# Patient Record
Sex: Female | Born: 1980 | Race: White | Hispanic: No | Marital: Married | Smoking: Never smoker
Health system: Southern US, Community
[De-identification: ages and names within clinical notes are randomized; demographics above are authoritative.]

## PROBLEM LIST (undated history)

## (undated) DIAGNOSIS — Z8619 Personal history of other infectious and parasitic diseases: Secondary | ICD-10-CM

## (undated) DIAGNOSIS — D649 Anemia, unspecified: Secondary | ICD-10-CM

## (undated) DIAGNOSIS — R011 Cardiac murmur, unspecified: Secondary | ICD-10-CM

## (undated) DIAGNOSIS — R51 Headache: Secondary | ICD-10-CM

## (undated) DIAGNOSIS — R519 Headache, unspecified: Secondary | ICD-10-CM

## (undated) DIAGNOSIS — T7840XA Allergy, unspecified, initial encounter: Secondary | ICD-10-CM

## (undated) DIAGNOSIS — F419 Anxiety disorder, unspecified: Secondary | ICD-10-CM

## (undated) HISTORY — DX: Headache, unspecified: R51.9

## (undated) HISTORY — DX: Personal history of other infectious and parasitic diseases: Z86.19

## (undated) HISTORY — DX: Anemia, unspecified: D64.9

## (undated) HISTORY — PX: BREAST SURGERY: SHX581

## (undated) HISTORY — DX: Allergy, unspecified, initial encounter: T78.40XA

## (undated) HISTORY — PX: KIDNEY SURGERY: SHX687

## (undated) HISTORY — DX: Cardiac murmur, unspecified: R01.1

## (undated) HISTORY — DX: Anxiety disorder, unspecified: F41.9

## (undated) HISTORY — DX: Headache: R51

---
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
## 2001-03-13 ENCOUNTER — Other Ambulatory Visit: Admission: RE | Admit: 2001-03-13 | Discharge: 2001-03-13 | Payer: Self-pay | Admitting: Obstetrics and Gynecology

## 2001-10-14 ENCOUNTER — Encounter (INDEPENDENT_AMBULATORY_CARE_PROVIDER_SITE_OTHER): Payer: Self-pay | Admitting: *Deleted

## 2001-10-14 ENCOUNTER — Ambulatory Visit (HOSPITAL_BASED_OUTPATIENT_CLINIC_OR_DEPARTMENT_OTHER): Admission: RE | Admit: 2001-10-14 | Discharge: 2001-10-14 | Payer: Self-pay | Admitting: Surgery

## 2004-06-17 ENCOUNTER — Emergency Department (HOSPITAL_COMMUNITY): Admission: EM | Admit: 2004-06-17 | Discharge: 2004-06-18 | Payer: Self-pay | Admitting: Emergency Medicine

## 2004-06-20 ENCOUNTER — Ambulatory Visit: Payer: Self-pay | Admitting: *Deleted

## 2004-06-20 ENCOUNTER — Inpatient Hospital Stay (HOSPITAL_COMMUNITY): Admission: EM | Admit: 2004-06-20 | Discharge: 2004-06-25 | Payer: Self-pay | Admitting: Urology

## 2004-06-21 ENCOUNTER — Encounter (INDEPENDENT_AMBULATORY_CARE_PROVIDER_SITE_OTHER): Payer: Self-pay | Admitting: Specialist

## 2005-12-23 IMAGING — CT CT ABDOMEN WO/W CM
1 of 4 series · 11 of 32 positions shown, 17 images · IV contrast (omnipaque)
Comparison: none

CLINICAL DATA: 23 year old; pyelonephritis with worsening pain and fever.  Clinical suspicion for an abscess.
TECHNIQUE: Helical CT examination of the abdomen and pelvis was performed pre- and post- administration of a total of 100 cc of Omnipaque 300.  This study is compared with a prior CT from 06/18/04.
 The patient has developed a right-sided pleural effusion with overlying right basilar atelectasis.  There is also minimal left basilar atelectasis.
 CT ABDOMEN WITHOUT AND WITH CONTRAST:
 Noncontrasted images of the abdomen show areas of high attenuation in the large cyst and also around the right kidney.  The findings suggest hemorrhage.  Fat planes around the kidney are blurred.  There is fluid tracking down into the pelvis.  
 The contrast enhanced images show normal enhancement of the right kidney.  Again, there appears to be some hemorrhage in this large cyst and some hemorrhage outside the kidney.  
 On the delayed images, there is a new area of increased attenuation within the cyst which may be evidence of active bleeding or extravasation from the collecting system into the cyst.  I really don?t see any findings in the remainder of the kidneys to suggest pyelonephritis.  It is possible that this is an abscess that has bled.  It could be a cystic neoplasm that has hemorrhaged.  There is also some blood in the hepatorenal fossa and around the gallbladder.  
 The left kidney is normal.  The pancreas, adrenal glands, liver, and spleen demonstrate no significant findings.

[Series 3: abd_pel 5.0 b40f st · axial · 0.61mm/px · z∈[-638,-222]mm · 11 of 101 slices shown, 17 images]
[im 9/101  soft-tissue]
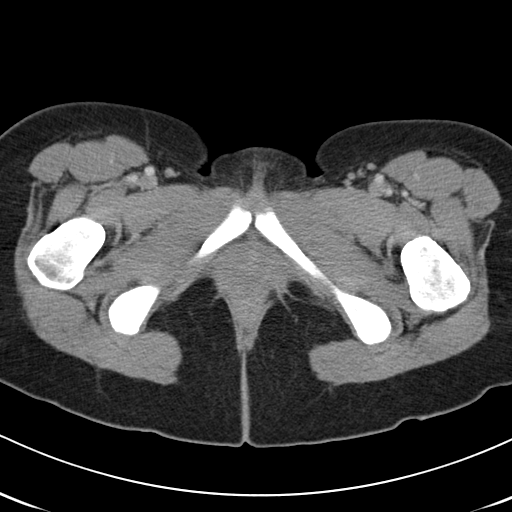
[im 9/101  bone]
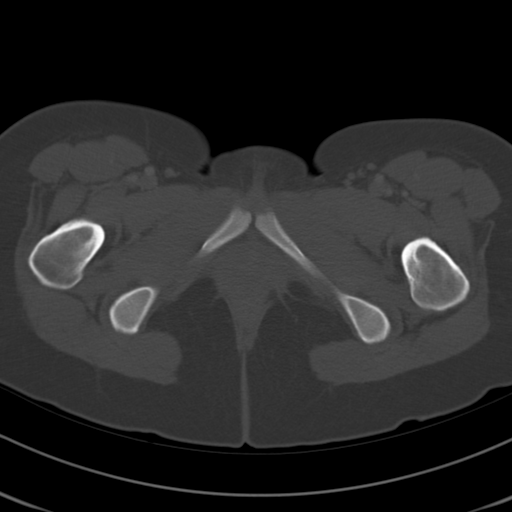
[im 17/101  soft-tissue]
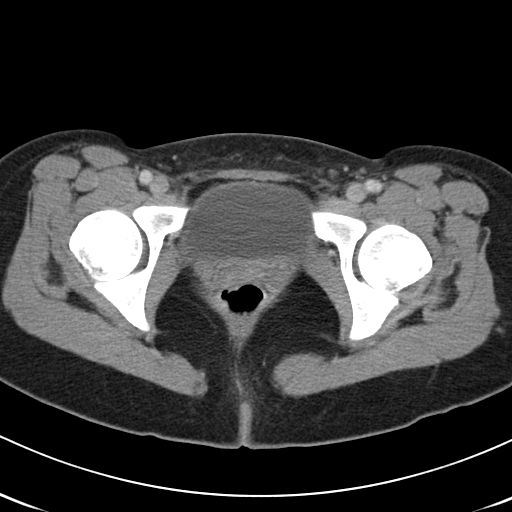
[im 26/101  soft-tissue]
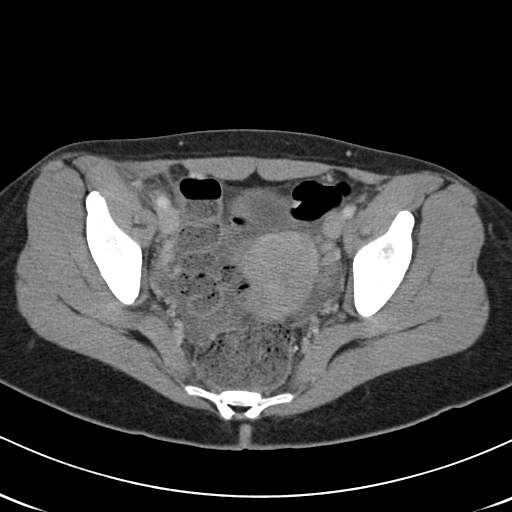
[im 34/101  soft-tissue]
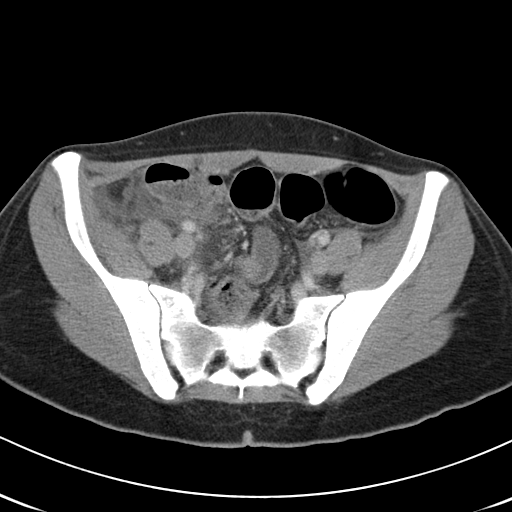
[im 42/101  soft-tissue]
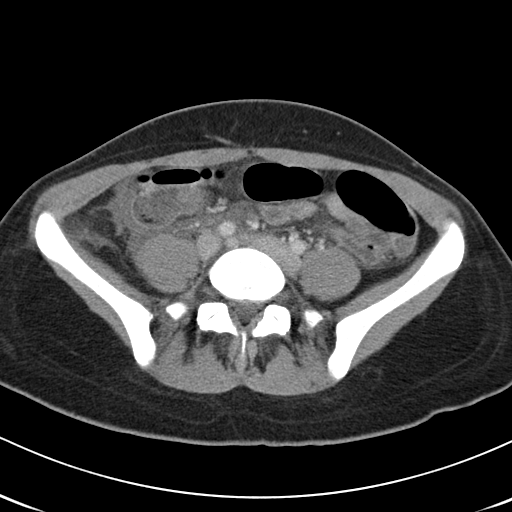
[im 51/101  soft-tissue]
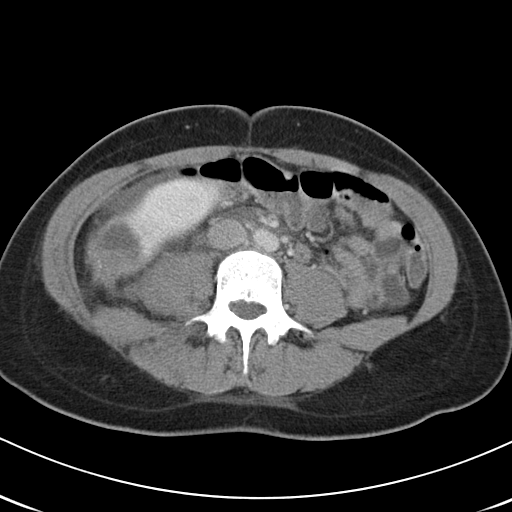
[im 59/101  soft-tissue]
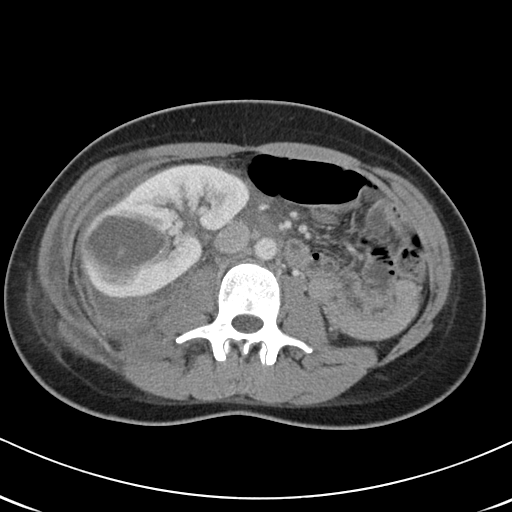
[im 67/101  soft-tissue]
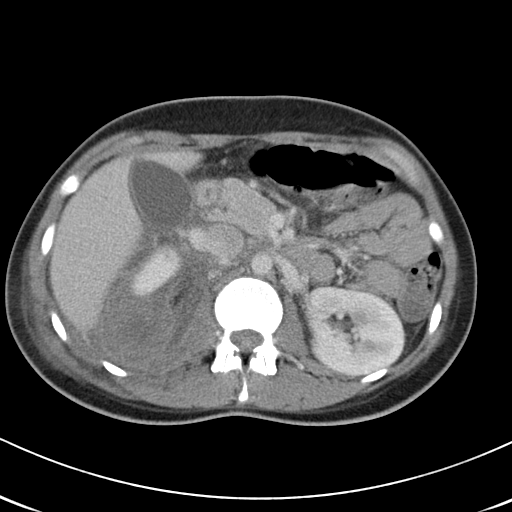
[im 67/101  lung]
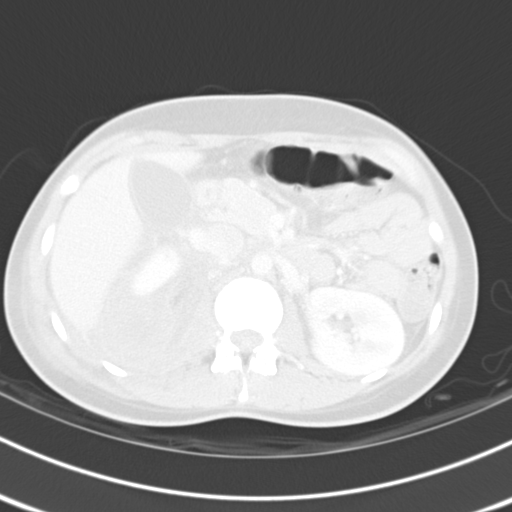
[im 76/101  soft-tissue]
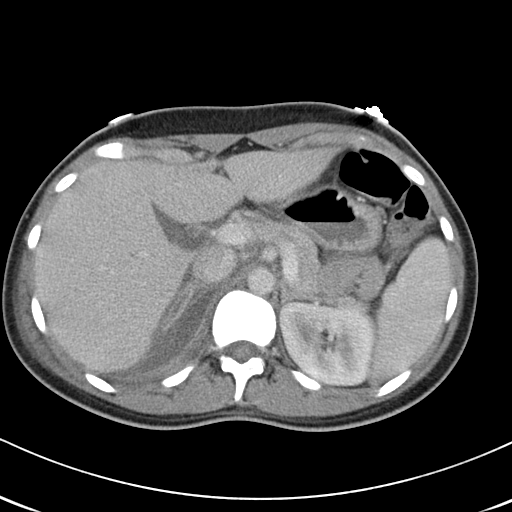
[im 76/101  lung]
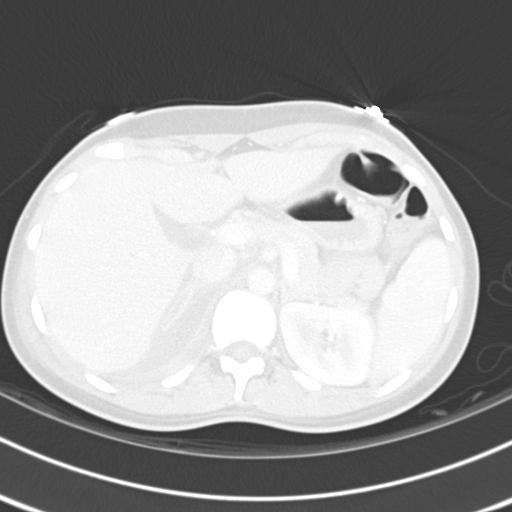
[im 76/101  bone]
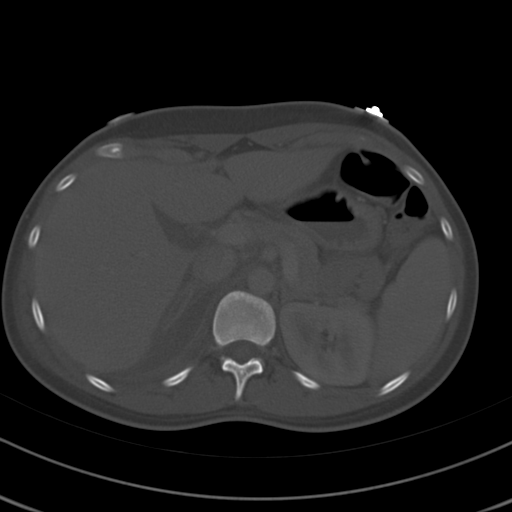
[im 84/101  soft-tissue]
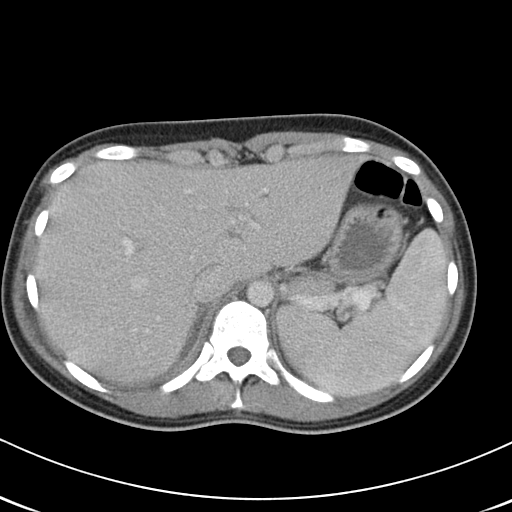
[im 84/101  lung]
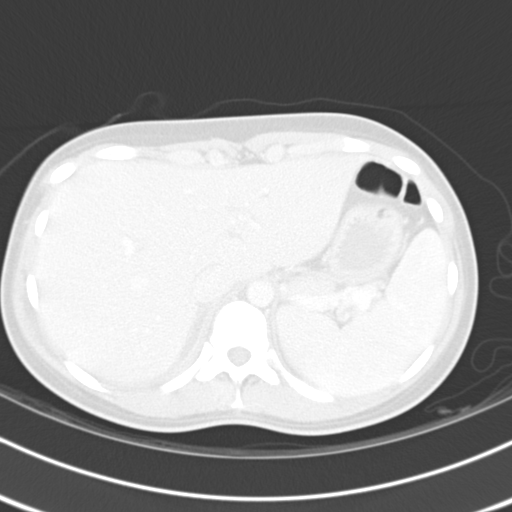
[im 92/101  soft-tissue]
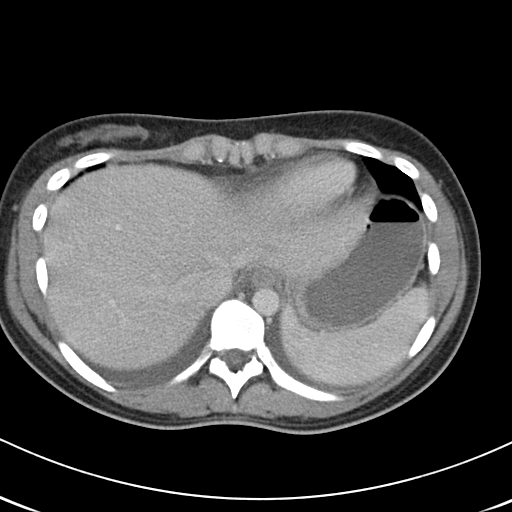
[im 92/101  lung]
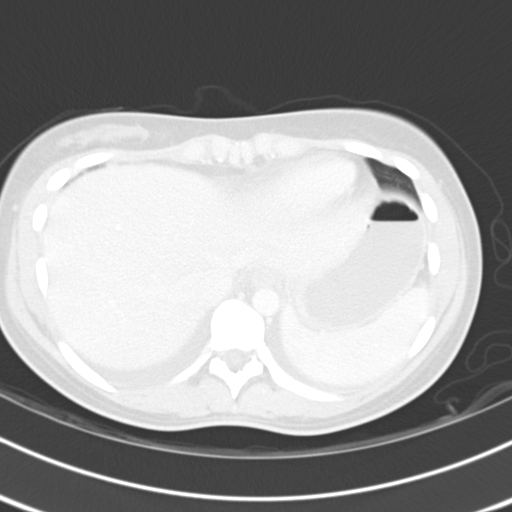

[11 of 32 positions shown; findings below may reference images not displayed]

IMPRESSION: Probable hemorrhagic process involving the right kidney.  It may be an abscess or a cystic neoplasm that has hemorrhaged.  
 CT PELVIS WITHOUT AND WITH CONTRAST:
 There is blood and/or fluid tracking down into the pelvis with a small to moderate amount of free fluid.  The rectum, sigmoid colon, and visualized small bowel loops are grossly normal.  The uterus and ovaries are grossly normal.
IMPRESSION: Free fluid tracking down into the pelvis from the right renal process.

## 2010-03-14 ENCOUNTER — Inpatient Hospital Stay (HOSPITAL_COMMUNITY)
Admission: AD | Admit: 2010-03-14 | Discharge: 2010-03-16 | DRG: 775 | Disposition: A | Discharge status: Home / Self Care | Payer: Medicaid Other | Attending: Obstetrics and Gynecology | Admitting: Obstetrics and Gynecology

## 2010-03-14 ENCOUNTER — Encounter (HOSPITAL_COMMUNITY): Payer: Self-pay | Admitting: Obstetrics and Gynecology

## 2010-03-14 LAB — CBC
HCT: 35.4 % — ABNORMAL LOW (ref 36.0–46.0)
Hemoglobin: 12.2 g/dL (ref 12.0–15.0)
MCH: 29.2 pg (ref 26.0–34.0)
MCHC: 34.5 g/dL (ref 30.0–36.0)
MCV: 84.7 fL (ref 78.0–100.0)
RBC: 4.18 MIL/uL (ref 3.87–5.11)

## 2010-03-15 LAB — CBC
HCT: 23 % — ABNORMAL LOW (ref 36.0–46.0)
Hemoglobin: 7.9 g/dL — ABNORMAL LOW (ref 12.0–15.0)
MCH: 29.4 pg (ref 26.0–34.0)
MCHC: 34.3 g/dL (ref 30.0–36.0)
MCV: 85.5 fL (ref 78.0–100.0)
RDW: 13.6 % (ref 11.5–15.5)

## 2010-03-16 LAB — RH IMMUNE GLOB WKUP(>/=20WKS)(NOT WOMEN'S HOSP)
Fetal Screen: NEGATIVE
Unit division: 0

## 2010-04-13 ENCOUNTER — Other Ambulatory Visit: Payer: Self-pay | Admitting: Obstetrics and Gynecology

## 2010-04-19 ENCOUNTER — Ambulatory Visit (HOSPITAL_COMMUNITY)
Admission: RE | Admit: 2010-04-19 | Discharge: 2010-04-19 | Disposition: A | Discharge status: Home / Self Care | Payer: Medicaid Other | Source: Ambulatory Visit | Attending: Obstetrics & Gynecology | Admitting: Obstetrics & Gynecology

## 2010-04-19 DIAGNOSIS — O925 Suppressed lactation: Secondary | ICD-10-CM | POA: Insufficient documentation

## 2010-06-30 NOTE — Op Note (Signed)
   Margaret [name], Margaret [name]                              ACCOUNT NO.:  [id number]   MEDICAL RECORD NO.:  [id number]                   PATIENT TYPE:  AMB   LOCATION:  DSC                                  FACILITY:  [facility]   PHYSICIAN:  Douglas A. Magnus Ivan, M.D.           DATE OF BIRTH:  12-19-1980   DATE OF PROCEDURE:  10/14/2001  DATE OF DISCHARGE:                                 OPERATIVE REPORT   PREOPERATIVE DIAGNOSES:  Right breast mass.   POSTOPERATIVE DIAGNOSES:  Right breast mass.   OPERATION PERFORMED:  Excision of right breast mass.   SURGEON:  Douglas A. Magnus Ivan, M.D.   ASSISTANT:   ANESTHESIA:  1% lidocaine and monitored anesthesia care.   ESTIMATED BLOOD LOSS:  Minimal.   DESCRIPTION OF PROCEDURE:  The patient was brought to the operating room and  identified.  She was placed supine on the operating table and  anesthesia  was induced.  Her right breast was then prepped and draped in the usual  sterile fashion.  The skin around the lateral edge of the areola was then  anesthetized.  A circumareolar incision was then created with a 15 blade.  An incision was carried down through the subcutaneous tissues with  electrocautery.  The mass was then easily identified in the incision and  then grasped with an Allis clamp and elevated out of the wound.  It was  found to be approximately 2 to 3 cm sized mass and was consistent grossly  with a fibroadenoma.  The mass was then excised in its entirely with  electrocautery.  The mass was sent to pathology for identification.  Next,  the wound was thoroughly irrigated.  Hemostasis appeared to be achieved.  The subcutaneous layer was then closed with interrupted 3-0 Vicryl sutures  and the skin was closed with running 4-0 Monocryl.  Steri-Strips, gauze and  tape were then applied.  The patient tolerated the procedure well.  All sponge, needle and instrument  counts were correct at the end of the procedure.  The patient was then  extubated in the operating room and taken in stable condition to the  recovery room.                                                  Douglas A. Magnus Ivan, M.D.    DAB/MEDQ  D:  10/14/2001  T:  10/14/2001  Job:  [id number]

## 2010-06-30 NOTE — H&P (Signed)
Margaret [name], HOLLINGSHEAD                  ACCOUNT NO.:  [id number]   MEDICAL RECORD NO.:  [id number]          PATIENT TYPE:  OBV   LOCATION:  0343                         FACILITY:  [facility]   PHYSICIAN:  Excell Seltzer. Annabell Howells, M.D.    DATE OF BIRTH:  1980/03/06   DATE OF ADMISSION:  06/19/2004  DATE OF DISCHARGE:                                HISTORY & PHYSICAL   CHIEF COMPLAINT:  Right flank pain and fever.   HISTORY:  Andreia is a 30 year old white female with a prior history of  cystitis who had the onset Saturday evening of severe right flank pain and  nausea with chills. She had a temperature to 103.5. She was seen in the  emergency room where she was diagnosed with acute right pyelonephritis. A CT  scan was obtained which revealed right renal swelling with a cyst in the  lower pole of the right kidney with some mural calcifications. She was given  Cipro and Percocet and discharged home but has continued to have significant  pain and nausea with anorexia. Her Tmax today was 102.1. She continuous to  have dysuria. Her pain remains fairly severe.   PAST HISTORY:  Pertinent for no drug allergies. Current medications include  Percocet and Cipro. Medical history is unremarkable. Surgical history is  pertinent for right breast biopsy 2 years ago.   SOCIAL HISTORY:  Negative for tobacco. She drinks rare alcohol. 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   FAMILY HISTORY:  Pertinent for high blood pressure and diabetes.   REVIEW OF SYSTEMS:  She has had the fever, chills, headaches, dizziness,  weakness, excessive thirst, nausea and vomiting, some shortness of breath,  sensation of difficulty emptying her bladder with slow stream and dysuria,  and some vaginal bleeding. She is otherwise without complaints per our  checklist except as above.   PHYSICAL EXAMINATION:  VITAL SIGNS:  Blood pressure is 91/54, pulse 70 and  slightly irregular, temperature 100.3.  GENERAL:  She is  an ill-appearing, thin white female in no acute distress,  alert and oriented x3.  HEAD AND FACE:  Normocephalic, atraumatic.  NECK:  Supple without mass.  LUNGS:  Clear with normal effort.  HEART:  Regular rate and rhythm.  ABDOMEN:  Soft with right CVA tenderness, right upper quadrant tenderness.  No mass, hepatosplenomegaly, or hernias are noted. She has no inguinal  adenopathy.  GENITOURINARY AND RECTAL:  Not indicated.  EXTREMITIES:  Had full range of motion without edema.  NEUROLOGIC:  She is grossly intact.  SKIN:  Hot and dry.   Urine today has greater than 50 white cells, 15-50 red cells, 3+ bacteria,  nitrite negative. In the emergency room it was nitrite positive with 3-6  white cells.   I have reviewed the ER note. I have also reviewed the CT films and report.   IMPRESSION:  Acute pyelonephritis with failure to respond to p.o. Cipro.  Secondary diagnoses include a right renal cyst. I would question whether  there may be infection there.   PLAN:  I  am going to admit her for 23-hour evaluation for IV fluids as well  as Rocephin and tobramycin. She had a urinalysis and culture obtained in the  office today. Blood cultures, CBC, and BMET will be obtained as well.      JJW/MEDQ  D:  06/19/2004  T:  06/19/2004  Job:  [id number]   cc:   Leona Singleton, M.D.  [address]  [place]  [place] [address]  Fax: [phone]

## 2010-06-30 NOTE — Discharge Summary (Signed)
NAMEJULINE, SANDERFORD                  ACCOUNT NO.:  [id number]   MEDICAL RECORD NO.:  [id number]          PATIENT TYPE:  INP   LOCATION:  0372                         FACILITY:  [facility]   PHYSICIAN:  Excell Seltzer. Annabell Howells, M.D.    DATE OF BIRTH:  Sep 06, 1980   DATE OF ADMISSION:  06/19/2004  DATE OF DISCHARGE:  06/25/2004                                 DISCHARGE SUMMARY   HISTORY OF PRESENT ILLNESS:  Briefly, Taylan is a 30 year old white female  with a history of cystitis who had the onset Saturday prior to admission of  severe right flank pain with nausea and chills and a temperature of 103.5.  She was seen in the ER where she was diagnosed with acute right  pyelonephritis.  A CT scan revealed right renal swelling that was felt to be  a cyst. There were some mural calcifications.  She was sent home on Cipro  and Percocet but continued to have significant pain, nausea, and anorexia  and returned with a recurrent fever to the office.  The pain remained fairly  severe.  A urine culture was not done at admission.   ALLERGIES:  NO DRUG ALLERGIES.   MEDICATIONS:  1.  Cipro.  2.  Percocet.   MEDICAL HISTORY:  Unremarkable.   SURGICAL HISTORY:  Pertinent for a right breast biopsy.   SOCIAL HISTORY:  Negative tobacco.  She drinks rare alcohol.  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   FAMILY HISTORY:  Pertinent for high blood pressure and diabetes.   REVIEW OF SYSTEMS:  On admission was pertinent for fever, chills, headache,  dizziness, weakness, excessive thirst, nausea and vomiting, shortness of  breath, sensation and difficulty in emptying bladder with a slow stream,  dysuria, and some vaginal bleeding.  She is otherwise without complaints  except as above.  For additional details of the history and physical see the  dictated note.   ACCESSORY CLINICAL INFORMATION:  Admission labs white count 18.4, hemoglobin  13.6.  Chemistries within normal limits with the  exception of a potassium of  3.4, glucose of 104, and an albumin of 3.1.  Blood cultures on admission  were negative.  A urine culture was done in the office and eventually  returned no growth.   HOSPITAL COURSE:  On the day of admission she was noted to have an irregular  heart beat and cardiology was consulted.  She was seen by Dr. Charlton Haws,  who felt she had a physiologic murmur and tachycardia secondary to her  infection.  She was placed on telemetry and 2-D echocardiogram was ordered.  On admission an IV was placed and she was started on antibiotics with  Rocephin 1 g IV q.24h. and Tobramycin per pharmacy.  A morphine PCA was  initiated, as well as Phenergan.  On the second postoperative day she was a  little better but was having some pain.  She was having itching with  morphine.  Her T-max was 102.9 but was currently afebrile.  The cardiology  consult was obtained  on Jun 20, 2004 because of the irregularity and she was  converted to a regular admission.  On the second hospital day she continued  to have pain, nausea and vomiting, and some increased shortness of breath.  Her T-max was 103.8.  Vital signs were stable.  O2 saturation had been 68%  on room air and 94% on 2 L.  She was quite ill-appearing and was having PVC  with questionable bigeminy.  A stat CT of the abdomen and pelvis with IV  contrast was obtained, as well as a chest x-ray, CBC, and BNP.   The CT scan demonstrated what appeared to be progression of an abscess in  the right kidney with some perinephric inflammation and pleural effusion.  It was felt that percutaneous drainage was indicated and this was performed  that day as an emergency.  Post drainage she felt much better.  Her clinical  condition continued to improve with a T-max of 101 on Jun 22, 2004 and  decreased abdominal tenderness.  She continued to have good urine output but  had a fair amount of drainage from the nephrostomy tube.  She was   transferred from the intensive care unit, where she had been placed upon her  decline to [address].  On Jun 23, 2004 she was having a little constipation and  some persistent pain but was otherwise doing well.  She did have some right  CVA tenderness.  She had been afebrile for 24 hours.  She was given Dulcolax  and Colace for her constipation.  She was changed back to p.o. Cipro, since  her office culture was negative.  On Jun 24, 2004 she continued to improve.  She was voiding well.  Her urine was clear.  The percutaneous drainage was  declining.  The aspiration from the cyst/abscess was no growth.  She was  continued on her oral Cipro on Jun 25, 2004.  She remained afebrile and was  felt to be ready for discharge.  She was discharged home with a final  diagnosis of a right renal abscess/infected cyst.  Secondary diagnosis  included cardiac arrhythmia.   COMPLICATIONS:  None.   DISCHARGE MEDICATIONS:  1.  Cipro 500 mg p.o. b.i.d.  2.  Vicodin one-two p.o. q.4-6h. p.r.n. pain.   FOLLOW UP:  She was instructed to follow up with me in five-seven days.  She  will record her drainage and change the dressing as needed.   DISPOSITION:  To home.   PROGNOSIS:  Good.   CONDITION:  Improved.       JJW/MEDQ  D:  07/18/2004  T:  07/18/2004  Job:  [id number]   cc:   Leona Singleton, M.D.  [address]  [place]  [place] [address]  Fax: [phone]

## 2010-06-30 NOTE — Consult Note (Signed)
NAMECAMRON, [name]                  ACCOUNT NO.:  [id number]   MEDICAL RECORD NO.:  [id number]          PATIENT TYPE:  INP   LOCATION:  0340                         FACILITY:  [facility]   PHYSICIAN:  Charlton Haws, M.D.     DATE OF BIRTH:  12-22-1980   DATE OF CONSULTATION:  DATE OF DISCHARGE:                                   CONSULTATION   REFERRING PHYSICIAN:  Excell Seltzer. Annabell Howells, M.D.   REASON FOR CONSULTATION:  Ms. Margaret [name] is a 30 year old female with no prior  cardiac history, admitted yesterday for inpatient treatment of right kidney  pyelonephritis.  She was felt to have an irregular heart beat on examination  earlier today, as well as a soft murmur and is thus now referred for cardiac  evaluation.   Patient presents with no known history of congenital heart disease,  dysrhythmia or cardiac murmurs.  She reports feeling her heart thumping  after activating her morphine PCA pump.  Patient is currently not on  telemetry.  An electrocardiogram was done during her examination,  revealing  sinus tachycardia with no premature beats.   Patient denies any prior history of tachy palpitations, dyspnea or chest  pain.   CURRENT MEDICATIONS:  Intravenous tobramycin, Rocephin and morphine PCA  pump.   PAST MEDICAL HISTORY:  Benign right breast mass excision in 2003.   REVIEW OF SYSTEMS:  As noted per HPI.  Remaining systems are negative.   SOCIAL HISTORY:  Patient lives in [place].  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  She denies any history of tobacco  smoking.  She drinks alcohol only on occasion.   FAMILY HISTORY:  Negative for any known history of coronary artery disease  or dysrhythmias.   PHYSICAL EXAMINATION:  VITAL SIGNS:  Blood pressure 110/47, temperature 98.5  (T max 102.9 yesterday), pulse 83, respirations 16, SaO2 91% on room air.  GENERAL:  A 30 year old female in no apparent distress.  HEENT:  Normocephalic and atraumatic.  NECK:  Preserved  bilateral carotid pulse with no bruits.  LUNGS:  Clear to auscultation in all fields.  HEART:  Regular rhythm with increased rate (S1 and S2), no murmurs, rubs or  gallops.  ABDOMEN:  Mild right upper quadrant/flank tenderness on palpation.  EXTREMITIES:  Preserved bilateral with distal pulses, no edema.  NEUROLOGIC:  No focal deficits.   DIAGNOSTIC STUDIES:  WBC 18.4, hemoglobin 13.6, hematocrit 39, platelet  count 128.  Sodium 135, potassium 3.4, BUN 11, creatinine 0.9, glucose 104.  Elevated total bilirubin 1.5, decreased albumin 3.1.  Blood culture is  negative thus far.  Urinalysis is positive for nitrites/leukocytes and few  bacteria.   Electrocardiogram, sinus tachycardia at 107 BPM, normal axis, nonspecific ST  changes.   Abdominal CT reveals markedly enlarged, edematous right kidney; large cyst  in the lower pole.   CT of the pelvis was normal.   IMPRESSION:  1.  Right kidney pyelonephritis.  2.  Sinus tachycardia.      1.  Secondary to #1.  3.  Question murmur.  1.  Suspect physiologic.  4.  Hypokalemia.  5.  Elevated total bilirubin.   PLAN:  Patient will be placed on telemetry to rule out dysrhythmia.  A 2D  echocardiogram will be ordered for assessment of LV function and to rule out  any significant valvular disease.  Mild hypokalemia will be repleted.  We  will be happy to continue following this delightful young patient with you.      GS/MEDQ  D:  06/20/2004  T:  06/20/2004  Job:  [id number]

## 2010-11-02 ENCOUNTER — Other Ambulatory Visit: Payer: Self-pay | Admitting: Obstetrics and Gynecology

## 2013-12-14 ENCOUNTER — Encounter (HOSPITAL_COMMUNITY): Payer: Self-pay | Admitting: Obstetrics and Gynecology

## 2014-12-18 ENCOUNTER — Ambulatory Visit (INDEPENDENT_AMBULATORY_CARE_PROVIDER_SITE_OTHER): Payer: BLUE CROSS/BLUE SHIELD | Admitting: Physician Assistant

## 2014-12-18 VITALS — BP 110/82 | HR 87 | Temp 97.7°F | Resp 20 | Ht 67.0 in | Wt 180.8 lb

## 2014-12-18 DIAGNOSIS — J069 Acute upper respiratory infection, unspecified: Secondary | ICD-10-CM

## 2014-12-18 DIAGNOSIS — J029 Acute pharyngitis, unspecified: Secondary | ICD-10-CM | POA: Diagnosis not present

## 2014-12-18 LAB — POCT RAPID STREP A (OFFICE): Rapid Strep A Screen: NEGATIVE

## 2014-12-18 MED ORDER — GUAIFENESIN ER 1200 MG PO TB12: 1.0000 | ORAL_TABLET | Freq: Two times a day (BID) | ORAL | Status: AC

## 2014-12-18 NOTE — Progress Notes (Signed)
   Margaret [name]  MRN: [id number] DOB: 30-May-1980  Subjective:  Pt presents to clinic with sore throat that started yesterday she thoughts she was getting sick but then the throat started getting worse.  It is only on the left side and is seems to be radiating into her left ear.  She is having some nasal congestion but only out of the left side.  Sick contacts with family.  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 Home treatment - theraflu for cold and sore throat, cough drops  There are no active problems to display for this patient.   No current outpatient prescriptions on file prior to visit.   No current facility-administered medications on file prior to visit.    No Known Allergies  Review of Systems  Constitutional: Negative for fever and chills.  HENT: Positive for congestion, postnasal drip, rhinorrhea and sore throat (left sided only).   Respiratory: Negative for cough.   Gastrointestinal: Negative for nausea, vomiting and diarrhea.  Musculoskeletal: Negative for myalgias.  Neurological: Negative for headaches.   Objective:  BP 110/82 mmHg  Pulse 87  Temp(Src) 97.7 F (36.5 C) (Oral)  Resp 20  Ht 5\' 7"  (1.702 m)  Wt 180 lb 12.8 oz (82.01 kg)  BMI 28.31 kg/m2  SpO2 99%  Breastfeeding? No  Physical Exam  Constitutional: She is oriented to person, place, and time and well-developed, well-nourished, and in no distress.  HENT:  Head: Normocephalic and atraumatic.  Right Ear: Hearing, tympanic membrane, external ear and ear canal normal.  Left Ear: Hearing, tympanic membrane, external ear and ear canal normal.  Nose: Nose normal.  Mouth/Throat: Uvula is midline and mucous membranes are normal. Posterior oropharyngeal erythema (worse on the left) present. No oropharyngeal exudate or posterior oropharyngeal edema.  PND present  Eyes: Conjunctivae are normal.  Neck: Normal range of motion.  Cardiovascular: Normal rate, regular rhythm and normal heart sounds.   No murmur  heard. Pulmonary/Chest: Effort normal and breath sounds normal.  Neurological: She is alert and oriented to person, place, and time. Gait normal.  Skin: Skin is warm and dry.  Psychiatric: Mood, memory, affect and judgment normal.  Vitals reviewed.  Results for orders placed or performed in visit on 12/18/14  POCT rapid strep A  Result Value Ref Range   Rapid Strep A Screen Negative Negative    Assessment and Plan :  Sore throat - Plan: POCT rapid strep A, Guaifenesin (MUCINEX MAXIMUM STRENGTH) 1200 MG TB12, Culture, Group A Strep  URI, acute   Symptomatic care d/w pt.  Benny LennertSarah Weber PA-C  [facility] 12/18/2014 1:51 PM

## 2014-12-18 NOTE — Patient Instructions (Signed)
Please push fluids.  Tylenol and Motrin for fever and body aches.    

## 2014-12-20 LAB — CULTURE, GROUP A STREP

## 2014-12-23 ENCOUNTER — Other Ambulatory Visit: Payer: Self-pay | Admitting: Physician Assistant

## 2014-12-23 DIAGNOSIS — J020 Streptococcal pharyngitis: Secondary | ICD-10-CM

## 2014-12-23 MED ORDER — AMOXICILLIN 875 MG PO TABS: 875.0000 mg | ORAL_TABLET | Freq: Two times a day (BID) | ORAL | Status: DC

## 2015-01-07 ENCOUNTER — Encounter: Payer: Self-pay | Admitting: *Deleted

## 2015-08-12 ENCOUNTER — Other Ambulatory Visit: Payer: Self-pay | Admitting: Obstetrics and Gynecology

## 2015-08-15 LAB — CYTOLOGY - PAP

## 2015-10-30 ENCOUNTER — Emergency Department (HOSPITAL_COMMUNITY)
Admission: EM | Admit: 2015-10-30 | Discharge: 2015-10-31 | Disposition: A | Discharge status: Home / Self Care | Payer: BLUE CROSS/BLUE SHIELD | Attending: Emergency Medicine | Admitting: Emergency Medicine

## 2015-10-30 ENCOUNTER — Emergency Department (HOSPITAL_COMMUNITY): Payer: BLUE CROSS/BLUE SHIELD

## 2015-10-30 ENCOUNTER — Encounter (HOSPITAL_COMMUNITY): Payer: Self-pay

## 2015-10-30 DIAGNOSIS — Y929 Unspecified place or not applicable: Secondary | ICD-10-CM | POA: Insufficient documentation

## 2015-10-30 DIAGNOSIS — W1830XA Fall on same level, unspecified, initial encounter: Secondary | ICD-10-CM | POA: Diagnosis not present

## 2015-10-30 DIAGNOSIS — Y999 Unspecified external cause status: Secondary | ICD-10-CM | POA: Insufficient documentation

## 2015-10-30 DIAGNOSIS — S93402A Sprain of unspecified ligament of left ankle, initial encounter: Secondary | ICD-10-CM | POA: Insufficient documentation

## 2015-10-30 DIAGNOSIS — Y9339 Activity, other involving climbing, rappelling and jumping off: Secondary | ICD-10-CM | POA: Insufficient documentation

## 2015-10-30 DIAGNOSIS — S99912A Unspecified injury of left ankle, initial encounter: Secondary | ICD-10-CM | POA: Diagnosis present

## 2015-10-30 NOTE — ED Provider Notes (Signed)
[facility]-EMERGENCY DEPT Provider Note   CSN: [id number] Arrival date & time: 10/30/15  2014  By signing my name below, I, Christel Mormon, attest that this documentation has been prepared under the direction and in the presence of Roxy Horseman, PA-C. Electronically Signed: Christel Mormon, Scribe. 10/30/2015. 9:34 PM.    History   Chief Complaint Chief Complaint  Patient presents with  . Ankle Injury    The history is provided by the patient. No language interpreter was used.   HPI Comments:  Margaret [name] is a 35 y.o. female who presents to the Emergency Department s/p an injury to her L ankle that occurred earlier today. Pt states that she was jumping on an inflatable and heard a "pop" when she landed. Pt complains of constant, worsening L ankle pain and swelling. She states that immediately after the injury she was unable to move her L foot. Pt states that as swelling worsened, she was unable to put weight on her L foot. Pt denies other complaints.   Past Medical History:  Diagnosis Date  . Allergy   . Anemia   . Anxiety   . Heart murmur     There are no active problems to display for this patient.   Past Surgical History:  Procedure Laterality Date  . BREAST SURGERY      OB History    Gravida Para Term Preterm AB Living   1             SAB TAB Ectopic Multiple Live Births                   Home Medications    Prior to Admission medications   Medication Sig Start Date End Date Taking? Authorizing Provider  amoxicillin (AMOXIL) 875 MG tablet Take 1 tablet (875 mg total) by mouth 2 (two) times daily. 12/23/14   Morrell Riddle, PA-C    Family History Family History  Problem Relation Age of Onset  . Heart disease Father   . Diabetes Maternal Grandmother   . Heart disease Maternal Grandmother   . Diabetes Maternal Grandfather   . Heart disease Paternal Grandmother     Social History Social History  Substance Use Topics  . Smoking status: Never Smoker  .  Smokeless tobacco: Never Used  . Alcohol use 1.2 oz/week    2 Standard drinks or equivalent per week     Allergies   Review of patient's allergies indicates no known allergies.   Review of Systems Review of Systems  Constitutional: Negative for fever.  Musculoskeletal: Positive for arthralgias, gait problem and joint swelling.  Neurological: Negative for weakness and numbness.     Physical Exam Updated Vital Signs BP 143/85 (BP Location: Left Arm)   Pulse 120   Temp 98 F (36.7 C) (Oral)   Resp 16   SpO2 100%   Physical Exam Physical Exam  Constitutional: Pt appears well-developed and well-nourished. No distress.  HENT:  Head: Normocephalic and atraumatic.  Eyes: Conjunctivae are normal.  Neck: Normal range of motion.  Cardiovascular: Normal rate, regular rhythm and intact distal pulses.   Capillary refill < 3 sec  Pulmonary/Chest: Effort normal and breath sounds normal.  Musculoskeletal: Pt exhibits tenderness to palpation over the lateral apsect. Pt exhibits no edema.  ROM: 4/5 limited by pain  Neurological: Pt  is alert. Coordination normal.  Sensation 5/5 Strength 4/5 limited by pain  Skin: Skin is warm and dry. Pt is not diaphoretic.  No tenting of the  skin  Psychiatric: Pt has a normal mood and affect.  Nursing note and vitals reviewed.   ED Treatments / Results  DIAGNOSTIC STUDIES:  Oxygen Saturation is 100% on RA, normal by my interpretation.    COORDINATION OF CARE:  9:34 PM Discussed treatment plan with pt at bedside and pt agreed to plan.   Labs (all labs ordered are listed, but only abnormal results are displayed) Labs Reviewed - No data to display  EKG  EKG Interpretation None       Radiology Dg Ankle Complete Left  Result Date: 10/31/2015 CLINICAL DATA:  Left ankle injury after a fall earlier today. Worsening left ankle pain and swelling. EXAM: LEFT ANKLE COMPLETE - 3+ VIEW COMPARISON:  None. FINDINGS: Lateral soft tissue swelling  about the left ankle. 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 No evidence of acute fracture or subluxation. No focal bone lesion or bone destruction. Bone cortex and trabecular architecture appear intact. No radiopaque soft tissue foreign bodies. IMPRESSION: Lateral soft tissue swelling.  No acute fracture or dislocation. Electronically Signed   By: Burman NievesWilliam  Stevens M.D.   On: 10/31/2015 00:39    Procedures Procedures (including critical care time)  Medications Ordered in ED Medications - No data to display   Initial Impression / Assessment and Plan / ED Course  I have reviewed the triage vital signs and the nursing notes.  Pertinent labs & imaging results that were available during my care of the patient were reviewed by me and considered in my medical decision making (see chart for details).  Clinical Course    Patient with left ankle pain. Plain films are remarkable for soft tissue swelling. Concern for ATFL versus CFL versus both ligament injury. Recommend orthopedic follow-up. Will give Cam Walker and crutches.   Final Clinical Impressions(s) / ED Diagnoses   Final diagnoses:  Ankle sprain, left, initial encounter    New Prescriptions New Prescriptions   No medications on file  I personally performed the services described in this documentation, which was scribed in my presence. The recorded information has been reviewed and is accurate.       Roxy Horsemanobert Braedan Meuth, PA-C 10/31/15 0122    Gilda Creasehristopher J Pollina, MD 10/31/15 [phone]

## 2015-10-30 NOTE — ED Triage Notes (Signed)
Patient complains of pain to left ankle after jumping on inflatable and heard a pop to ankle. Swelling to same, positive distal pulse.

## 2015-10-30 NOTE — ED Notes (Signed)
Pt to xray

## 2016-01-10 ENCOUNTER — Other Ambulatory Visit: Payer: Self-pay | Admitting: Obstetrics and Gynecology

## 2016-02-13 NOTE — L&D Delivery Note (Signed)
Delivery Note Patient pushed for less than 30 minutes after she was noted to be C/C/+2 At 2:07 PM 10 August 2016, a viable and healthy female was delivered via Vaginal, Spontaneous Delivery (Presentation: LOA).  APGAR: 8, 9; weight 8 lb 10.6 oz (3929 g).  Nuchal cord x 2 was reduced on perineum.  Shoulders and body easily delivered. Baby laid on maternal abdomen.  Placenta spontaneously delivered intact, 3 vessels noted.  Delayed cord clamping done, cord cut by father. Second degree lac repaired in routine fashion. Uterine atony alleviated by massage, IV pitocin and one dose of IM methergine  Anesthesia:  Epidural Episiotomy: None Lacerations: 2nd degree Suture Repair: 2.0 vicryl rapide Est. Blood Loss (mL): 500  Mom to postpartum.  Baby to Couplet care / Skin to Skin.  [name], Siria Calandro STACIA 08/11/2016, 9:36 AM

## 2016-03-12 LAB — OB RESULTS CONSOLE GC/CHLAMYDIA
Chlamydia: NEGATIVE
GC PROBE AMP, GENITAL: NEGATIVE

## 2016-03-12 LAB — OB RESULTS CONSOLE HIV ANTIBODY (ROUTINE TESTING): HIV: NONREACTIVE

## 2016-03-12 LAB — OB RESULTS CONSOLE ANTIBODY SCREEN: Antibody Screen: NEGATIVE

## 2016-03-12 LAB — OB RESULTS CONSOLE RPR: RPR: NONREACTIVE

## 2016-03-12 LAB — OB RESULTS CONSOLE ABO/RH: RH Type: NEGATIVE

## 2016-03-12 LAB — OB RESULTS CONSOLE RUBELLA ANTIBODY, IGM: RUBELLA: IMMUNE

## 2016-03-12 LAB — OB RESULTS CONSOLE HEPATITIS B SURFACE ANTIGEN: Hepatitis B Surface Ag: NEGATIVE

## 2016-07-13 ENCOUNTER — Other Ambulatory Visit: Payer: Self-pay | Admitting: Obstetrics and Gynecology

## 2016-07-13 LAB — OB RESULTS CONSOLE GBS: GBS: NEGATIVE

## 2016-08-07 ENCOUNTER — Other Ambulatory Visit: Payer: Self-pay | Admitting: Obstetrics & Gynecology

## 2016-08-08 ENCOUNTER — Telehealth (HOSPITAL_COMMUNITY): Payer: Self-pay | Admitting: *Deleted

## 2016-08-08 ENCOUNTER — Encounter (HOSPITAL_COMMUNITY): Payer: Self-pay | Admitting: *Deleted

## 2016-08-08 NOTE — Telephone Encounter (Signed)
Preadmission screen  

## 2016-08-10 ENCOUNTER — Encounter (HOSPITAL_COMMUNITY): Payer: Self-pay

## 2016-08-10 ENCOUNTER — Inpatient Hospital Stay (HOSPITAL_COMMUNITY): Payer: BLUE CROSS/BLUE SHIELD | Admitting: Anesthesiology

## 2016-08-10 ENCOUNTER — Inpatient Hospital Stay (HOSPITAL_COMMUNITY)
Admission: RE | Admit: 2016-08-10 | Discharge: 2016-08-12 | DRG: 775 | Disposition: A | Discharge status: Home / Self Care | Payer: BLUE CROSS/BLUE SHIELD | Source: Ambulatory Visit | Attending: Obstetrics & Gynecology | Admitting: Obstetrics & Gynecology

## 2016-08-10 DIAGNOSIS — Z3493 Encounter for supervision of normal pregnancy, unspecified, third trimester: Secondary | ICD-10-CM | POA: Diagnosis present

## 2016-08-10 DIAGNOSIS — Z3A39 39 weeks gestation of pregnancy: Secondary | ICD-10-CM

## 2016-08-10 DIAGNOSIS — O99214 Obesity complicating childbirth: Secondary | ICD-10-CM | POA: Diagnosis present

## 2016-08-10 DIAGNOSIS — Z6834 Body mass index (BMI) 34.0-34.9, adult: Secondary | ICD-10-CM | POA: Diagnosis not present

## 2016-08-10 LAB — CBC
HCT: 37.2 % (ref 36.0–46.0)
HEMOGLOBIN: 12.6 g/dL (ref 12.0–15.0)
MCH: 30 pg (ref 26.0–34.0)
MCHC: 33.9 g/dL (ref 30.0–36.0)
MCV: 88.6 fL (ref 78.0–100.0)
PLATELETS: 144 10*3/uL — AB (ref 150–400)
RBC: 4.2 MIL/uL (ref 3.87–5.11)
RDW: 13.7 % (ref 11.5–15.5)
WBC: 7.5 10*3/uL (ref 4.0–10.5)

## 2016-08-10 LAB — TYPE AND SCREEN
ABO/RH(D): B NEG
Antibody Screen: NEGATIVE

## 2016-08-10 LAB — RPR: RPR: NONREACTIVE

## 2016-08-10 MED ORDER — LIDOCAINE HCL (PF) 1 % IJ SOLN
30.0000 mL | INTRAMUSCULAR | Status: DC | PRN
  Filled 2016-08-10: qty 30

## 2016-08-10 MED ORDER — ACETAMINOPHEN 325 MG PO TABS: 650.0000 mg | ORAL_TABLET | ORAL | Status: DC | PRN

## 2016-08-10 MED ORDER — METHYLERGONOVINE MALEATE 0.2 MG PO TABS: 0.2000 mg | ORAL_TABLET | ORAL | Status: DC | PRN

## 2016-08-10 MED ORDER — SOD CITRATE-CITRIC ACID 500-334 MG/5ML PO SOLN: 30.0000 mL | ORAL | Status: DC | PRN

## 2016-08-10 MED ORDER — OXYCODONE-ACETAMINOPHEN 5-325 MG PO TABS: 2.0000 | ORAL_TABLET | ORAL | Status: DC | PRN

## 2016-08-10 MED ORDER — DIPHENHYDRAMINE HCL 25 MG PO CAPS: 25.0000 mg | ORAL_CAPSULE | Freq: Four times a day (QID) | ORAL | Status: DC | PRN

## 2016-08-10 MED ORDER — LACTATED RINGERS IV SOLN: 500.0000 mL | INTRAVENOUS | Status: DC | PRN

## 2016-08-10 MED ORDER — ONDANSETRON HCL 4 MG/2ML IJ SOLN: 4.0000 mg | INTRAMUSCULAR | Status: DC | PRN

## 2016-08-10 MED ORDER — LACTATED RINGERS IV SOLN
INTRAVENOUS | Status: DC
  Administered 2016-08-10: 08:00:00 via INTRAVENOUS

## 2016-08-10 MED ORDER — ONDANSETRON HCL 4 MG/2ML IJ SOLN: 4.0000 mg | Freq: Four times a day (QID) | INTRAMUSCULAR | Status: DC | PRN

## 2016-08-10 MED ORDER — PHENYLEPHRINE 40 MCG/ML (10ML) SYRINGE FOR IV PUSH (FOR BLOOD PRESSURE SUPPORT)
80.0000 ug | PREFILLED_SYRINGE | INTRAVENOUS | Status: DC | PRN
  Filled 2016-08-10: qty 5
  Filled 2016-08-10: qty 10

## 2016-08-10 MED ORDER — PHENYLEPHRINE 40 MCG/ML (10ML) SYRINGE FOR IV PUSH (FOR BLOOD PRESSURE SUPPORT)
80.0000 ug | PREFILLED_SYRINGE | INTRAVENOUS | Status: DC | PRN
  Filled 2016-08-10: qty 5

## 2016-08-10 MED ORDER — OXYTOCIN 40 UNITS IN LACTATED RINGERS INFUSION - SIMPLE MED: 2.5000 [IU]/h | INTRAVENOUS | Status: DC | PRN

## 2016-08-10 MED ORDER — ZOLPIDEM TARTRATE 5 MG PO TABS: 5.0000 mg | ORAL_TABLET | Freq: Every evening | ORAL | Status: DC | PRN

## 2016-08-10 MED ORDER — COCONUT OIL OIL: 1.0000 "application " | TOPICAL_OIL | Status: DC | PRN

## 2016-08-10 MED ORDER — OXYTOCIN 40 UNITS IN LACTATED RINGERS INFUSION - SIMPLE MED
2.5000 [IU]/h | INTRAVENOUS | Status: DC
  Administered 2016-08-10: 2.5 [IU]/h via INTRAVENOUS

## 2016-08-10 MED ORDER — ONDANSETRON HCL 4 MG PO TABS: 4.0000 mg | ORAL_TABLET | ORAL | Status: DC | PRN

## 2016-08-10 MED ORDER — OXYTOCIN 40 UNITS IN LACTATED RINGERS INFUSION - SIMPLE MED
1.0000 m[IU]/min | INTRAVENOUS | Status: DC
  Administered 2016-08-10: 2 m[IU]/min via INTRAVENOUS
  Filled 2016-08-10: qty 1000

## 2016-08-10 MED ORDER — SIMETHICONE 80 MG PO CHEW: 80.0000 mg | CHEWABLE_TABLET | ORAL | Status: DC | PRN

## 2016-08-10 MED ORDER — LACTATED RINGERS IV SOLN: 500.0000 mL | Freq: Once | INTRAVENOUS | Status: DC

## 2016-08-10 MED ORDER — FENTANYL 2.5 MCG/ML BUPIVACAINE 1/10 % EPIDURAL INFUSION (WH - ANES)
14.0000 mL/h | INTRAMUSCULAR | Status: DC | PRN
  Administered 2016-08-10: 14 mL/h via EPIDURAL
  Filled 2016-08-10: qty 100

## 2016-08-10 MED ORDER — METHYLERGONOVINE MALEATE 0.2 MG/ML IJ SOLN: 0.2000 mg | INTRAMUSCULAR | Status: DC | PRN

## 2016-08-10 MED ORDER — LIDOCAINE HCL (PF) 1 % IJ SOLN
INTRAMUSCULAR | Status: DC | PRN
  Administered 2016-08-10 (×2): 5 mL via EPIDURAL

## 2016-08-10 MED ORDER — EPHEDRINE 5 MG/ML INJ
10.0000 mg | INTRAVENOUS | Status: DC | PRN
  Filled 2016-08-10: qty 2

## 2016-08-10 MED ORDER — SENNOSIDES-DOCUSATE SODIUM 8.6-50 MG PO TABS
2.0000 | ORAL_TABLET | ORAL | Status: DC
  Administered 2016-08-10: 2 via ORAL
  Filled 2016-08-10 (×2): qty 2

## 2016-08-10 MED ORDER — WITCH HAZEL-GLYCERIN EX PADS: 1.0000 "application " | MEDICATED_PAD | CUTANEOUS | Status: DC | PRN

## 2016-08-10 MED ORDER — PRENATAL MULTIVITAMIN CH
1.0000 | ORAL_TABLET | Freq: Every day | ORAL | Status: DC
  Administered 2016-08-11 – 2016-08-12 (×2): 1 via ORAL
  Filled 2016-08-10 (×2): qty 1

## 2016-08-10 MED ORDER — OXYCODONE-ACETAMINOPHEN 5-325 MG PO TABS: 1.0000 | ORAL_TABLET | ORAL | Status: DC | PRN

## 2016-08-10 MED ORDER — IBUPROFEN 600 MG PO TABS
600.0000 mg | ORAL_TABLET | Freq: Four times a day (QID) | ORAL | Status: DC
  Administered 2016-08-10 – 2016-08-12 (×8): 600 mg via ORAL
  Filled 2016-08-10 (×8): qty 1

## 2016-08-10 MED ORDER — DIBUCAINE 1 % RE OINT: 1.0000 "application " | TOPICAL_OINTMENT | RECTAL | Status: DC | PRN

## 2016-08-10 MED ORDER — OXYTOCIN BOLUS FROM INFUSION
500.0000 mL | Freq: Once | INTRAVENOUS | Status: AC
  Administered 2016-08-10: 500 mL via INTRAVENOUS

## 2016-08-10 MED ORDER — BENZOCAINE-MENTHOL 20-0.5 % EX AERO
1.0000 "application " | INHALATION_SPRAY | CUTANEOUS | Status: DC | PRN
  Administered 2016-08-10: 1 via TOPICAL
  Filled 2016-08-10: qty 56

## 2016-08-10 MED ORDER — TERBUTALINE SULFATE 1 MG/ML IJ SOLN
0.2500 mg | Freq: Once | INTRAMUSCULAR | Status: DC | PRN
  Filled 2016-08-10: qty 1

## 2016-08-10 MED ORDER — DIPHENHYDRAMINE HCL 50 MG/ML IJ SOLN: 12.5000 mg | INTRAMUSCULAR | Status: DC | PRN

## 2016-08-10 MED ORDER — METHYLERGONOVINE MALEATE 0.2 MG/ML IJ SOLN
0.2000 mg | Freq: Once | INTRAMUSCULAR | Status: AC
  Administered 2016-08-10: 0.2 mg via INTRAMUSCULAR

## 2016-08-10 MED ORDER — TETANUS-DIPHTH-ACELL PERTUSSIS 5-2.5-18.5 LF-MCG/0.5 IM SUSP: 0.5000 mL | Freq: Once | INTRAMUSCULAR | Status: DC

## 2016-08-10 NOTE — Anesthesia Pain Management Evaluation Note (Signed)
  [facility] Pain Management Visit Note  Patient: Margaret [name] PatientAmber Letterman, 36 y.o., female  "Hello I am a member of the anesthesia team at [facility]. We have an anesthesia team available at all times to provide care throughout the hospital, including epidural management and anesthesia for C-section. I don't know your plan for the delivery whether it a natural birth, water birth, IV sedation, nitrous supplementation, doula or epidural, but we want to meet your pain goals."   1.Was your pain managed to your expectations on prior hospitalizations?   Yes   2.What is your expectation for pain management during this hospitalization?     Epidural  3.How can we help you reach that goal? Support prn  Record the patient's initial score and the patient's pain goal.   Pain: 0  Pain Goal: 5 [facility] wants you to be able to say your pain was always managed very well.  [facility] - BayamonWRINKLE,Montray Kliebert 08/10/2016

## 2016-08-10 NOTE — Anesthesia Postprocedure Evaluation (Signed)
Anesthesia Post Note  Patient: Therapist, sportsAmber [name]  Procedure(s) Performed: * No procedures listed *     Patient location during evaluation: Mother Baby Anesthesia Type: Epidural Level of consciousness: awake and alert Pain management: pain level controlled Vital Signs Assessment: post-procedure vital signs reviewed and stable Respiratory status: spontaneous breathing, nonlabored ventilation and respiratory function stable Cardiovascular status: stable Postop Assessment: no headache, no backache and epidural receding Anesthetic complications: no    Last Vitals:  Vitals:   08/10/16 1600 08/10/16 1700  BP: (!) 125/50 (!) 124/49  Pulse: 67 63  Resp: 18 18  Temp: 36.4 C 36.6 C    Last Pain:  Vitals:   08/10/16 1700  TempSrc: Oral  PainSc: 0-No pain   Pain Goal:                 [name],[name]

## 2016-08-10 NOTE — Lactation Note (Signed)
This note was copied from a baby's chart. Lactation Consultation Note  Patient Name: Margaret [name] Today's Date: 08/10/2016 Reason for consult: Initial assessment   Initial consult with mom of < 1 hour old infant. Mom reports she BF her 726 yo for 3 months and stopped as infant did not gain well, she was informed she either did not have enough milk or milk did not have enough fat in it. We discussed Family connects, pediatrician, and BF Support Groups available for weight checks.   Infant STS with mom, quietly alert. Infant began licking lips and rooting, enc mom to latch infant to breast. Mom voiced she wanted to wait until infant is cleaned up. Informed mom that infant will not have bath until 6-8 hours old. Mom reported her 326 yo was cleaned up right after birth. Discussed that importance of STS and early feeding. Mom seemed reluctant to latch infant, after about 20 minutes mom independently latched infant using good head and pillow support in the football hold. Mom did well with breast massage and hand expression. Infant still feeding when LC left room.   Mom with compressible breasts and semi compressible areola with everted nipples, colostrum easily expressible from breasts. Enc mom to feed infant STS 8-12 x a day for as long as infant wants offering both breasts with each feeding. Enc mom to use good head and pillow support with feeding. Enc mom to massage/compress breast with feeding. Reviewed BF basics, feeding cues, colostrum, milk coming to volume, supply and demand and NB nutritional needs and feeding behavior.   BF Resources Handout and LC Brochure given, mom informed of IP/OP Services, BF Support Groups and LC phone #. Enc mom to call out for feeding assistance as needed. Mom without further questions/concerns at this time..   Maternal Data Formula Feeding for Exclusion: Yes Reason for exclusion: Mother's choice to formula and breast feed on admission Has patient been taught Hand  Expression?: Yes Does the patient have breastfeeding experience prior to this delivery?: Yes  Feeding Feeding Type: Breast Fed Length of feed:  (still feeding when LC left room)  LATCH Score/Interventions Latch: Grasps breast easily, tongue down, lips flanged, rhythmical sucking.  Audible Swallowing: A few with stimulation Intervention(s): Skin to skin;Hand expression;Alternate breast massage  Type of Nipple: Everted at rest and after stimulation  Comfort (Breast/Nipple): Soft / non-tender     Hold (Positioning): Assistance needed to correctly position infant at breast and maintain latch. Intervention(s): Breastfeeding basics reviewed;Support Pillows;Position options;Skin to skin  LATCH Score: 8  Lactation Tools Discussed/Used WIC Program: No   Consult Status Consult Status: Follow-up Date: 08/11/16 Follow-up type: In-patient    Margaret [name] 08/10/2016, 3:06 PM

## 2016-08-10 NOTE — Anesthesia Procedure Notes (Signed)
Epidural Patient location during procedure: OB Start time: 08/10/2016 11:03 AM End time: 08/10/2016 11:13 AM  Staffing Anesthesiologist: Heather RobertsSINGER, Margaret Lapka Performed: anesthesiologist   Preanesthetic Checklist Completed: patient identified, site marked, pre-op evaluation, timeout performed, IV checked, risks and benefits discussed and monitors and equipment checked  Epidural Patient position: sitting Prep: DuraPrep Patient monitoring: heart rate, cardiac monitor, continuous pulse ox and blood pressure Approach: midline Location: L2-L3 Injection technique: LOR saline  Needle:  Needle type: Tuohy  Needle gauge: 17 G Needle length: 9 cm Needle insertion depth: 6 cm Catheter size: 20 Guage Catheter at skin depth: 10 cm Test dose: negative and Other  Assessment Events: blood not aspirated, injection not painful, no injection resistance and negative IV test  Additional Notes Informed consent obtained prior to proceeding including risk of failure, 1% risk of PDPH, risk of minor discomfort and bruising.  Discussed rare but serious complications including epidural abscess, permanent nerve injury, epidural hematoma.  Discussed alternatives to epidural analgesia and patient desires to proceed.  Timeout performed pre-procedure verifying patient name, procedure, and platelet count.  Patient tolerated procedure well.

## 2016-08-10 NOTE — Anesthesia Preprocedure Evaluation (Signed)
Anesthesia Evaluation  Patient identified by MRN, date of birth, ID band Patient awake    Reviewed: Allergy & Precautions, NPO status , Patient's Chart, lab work & pertinent test results  History of Anesthesia Complications Negative for: history of anesthetic complications  Airway Mallampati: II  TM Distance: >3 FB Neck ROM: Full    Dental no notable dental hx. (+) Dental Advisory Given   Pulmonary neg pulmonary ROS,    Pulmonary exam normal        Cardiovascular negative cardio ROS Normal cardiovascular exam     Neuro/Psych  Headaches, PSYCHIATRIC DISORDERS Anxiety    GI/Hepatic negative GI ROS, Neg liver ROS,   Endo/Other  negative endocrine ROSMorbid obesity  Renal/GU negative Renal ROS  negative genitourinary   Musculoskeletal negative musculoskeletal ROS (+)   Abdominal   Peds negative pediatric ROS (+)  Hematology negative hematology ROS (+)   Anesthesia Other Findings   Reproductive/Obstetrics negative OB ROS                             Anesthesia Physical Anesthesia Plan  ASA: II  Anesthesia Plan: Epidural   Post-op Pain Management:    Induction:   PONV Risk Score and Plan:   Airway Management Planned: Natural Airway  Additional Equipment:   Intra-op Plan:   Post-operative Plan:   Informed Consent: I have reviewed the patients History and Physical, chart, labs and discussed the procedure including the risks, benefits and alternatives for the proposed anesthesia with the patient or authorized representative who has indicated his/her understanding and acceptance.   Dental advisory given  Plan Discussed with: Anesthesiologist  Anesthesia Plan Comments:         Anesthesia Quick Evaluation

## 2016-08-10 NOTE — H&P (Signed)
Margaret [name] is a 36 y.o. female G2P1001 at 39 weeks 4 days presenting for elective IOL at term.  Prenatal course has been uncomplicated.  OB History    Gravida Para Term Preterm AB Living   2 1 1    0 1   SAB TAB Ectopic Multiple Live Births     0     1     Past Medical History:  Diagnosis Date  . Allergy   . Anemia   . Anxiety   . Headache   . Heart murmur   . Hx of varicella    Past Surgical History:  Procedure Laterality Date  . BREAST SURGERY    . KIDNEY SURGERY     stent for infection   Family History: family history includes Cancer in her maternal grandmother; Diabetes in her maternal grandfather and maternal grandmother; Heart disease in her father, maternal grandmother, and paternal grandmother. Social History:  reports that she has never smoked. She has never used smokeless tobacco. She reports that she drinks about 1.2 oz of alcohol per week . She reports that she does not use drugs.     Maternal Diabetes: No Genetic Screening: Normal Maternal Ultrasounds/Referrals: Normal Fetal Ultrasounds or other Referrals:  Other: Anatomy scan normal Maternal Substance Abuse:  No Significant Maternal Medications:  None Significant Maternal Lab Results:  Lab values include: Group B Strep negative Other Comments:  None  ROS History Dilation: 4 Effacement (%): 80 Station: -1 Exam by:: Dr. Mora ApplPinn Blood pressure 127/80, pulse 78, temperature 97.7 F (36.5 C), temperature source Oral, height 5\' 7"  (1.702 m), weight 100.7 kg (222 lb), last menstrual period 11/10/2015. Exam Physical Exam  Prenatal labs: ABO, Rh: --/--/B NEG (06/29 0750) Antibody: NEG (06/29 0750) Rubella: Immune (01/29 0000) RPR: Nonreactive (01/29 0000)  HBsAg: Negative (01/29 0000)  HIV: Non-reactive (01/29 0000)  GBS: Negative (06/01 0000)   Assessment/Plan: 36 yo G2P1 at 39 weeks 4 days in early active labor AROM performed clear amniotic fluid Pitocin high dose 2x2 Epidural on demand Anticipate  nsvd  Nakia Remmers STACIA 08/10/2016, 9:06 AM

## 2016-08-11 LAB — CBC
HEMATOCRIT: 35.5 % — AB (ref 36.0–46.0)
Hemoglobin: 11.9 g/dL — ABNORMAL LOW (ref 12.0–15.0)
MCH: 29.6 pg (ref 26.0–34.0)
MCHC: 33.5 g/dL (ref 30.0–36.0)
MCV: 88.3 fL (ref 78.0–100.0)
PLATELETS: 116 10*3/uL — AB (ref 150–400)
RBC: 4.02 MIL/uL (ref 3.87–5.11)
RDW: 13.7 % (ref 11.5–15.5)
WBC: 8.9 10*3/uL (ref 4.0–10.5)

## 2016-08-11 MED ORDER — RHO D IMMUNE GLOBULIN 1500 UNIT/2ML IJ SOSY
300.0000 ug | PREFILLED_SYRINGE | Freq: Once | INTRAMUSCULAR | Status: AC
  Administered 2016-08-11: 300 ug via INTRAVENOUS
  Filled 2016-08-11: qty 2

## 2016-08-11 NOTE — Lactation Note (Signed)
This note was copied from a baby's chart. Lactation Consultation Note  Patient Name: Margaret [name] [name] JXBJY'NToday's Date: 08/11/2016 Reason for consult: Follow-up assessment   Follow up with mom of 26 hour old infant. Infant with 7 BF for 20-40 minutes, 1 attempt, 2 voids and 5 stools since birth. Infant weight 8 lb 7.6 oz with weight loss of 2 % since birth. LATCH scores 8-9.   Mom reports feedings are going well. She reports some nipple tenderness with initial latch that improves with feeding. She has no questions/concerns at this time.     Maternal Data Formula Feeding for Exclusion: Yes Reason for exclusion: Mother's choice to formula and breast feed on admission Has patient been taught Hand Expression?: Yes Does the patient have breastfeeding experience prior to this delivery?: Yes  Feeding Feeding Type: Breast Fed Length of feed: 20 min  LATCH Score/Interventions                      Lactation Tools Discussed/Used     Consult Status Consult Status: Follow-up Date: 08/12/16 Follow-up type: In-patient    Margaret [name] 08/11/2016, 4:24 PM

## 2016-08-11 NOTE — Progress Notes (Signed)
Post Partum Day 1 Subjective: no complaints, up ad lib, voiding, tolerating PO, + flatus and breast feeding  Objective: Blood pressure (!) 101/41, pulse 72, temperature 98 F (36.7 C), temperature source Oral, resp. rate 16, height 5\' 7"  (1.702 m), weight 100.7 kg (222 lb), last menstrual period 11/10/2015, SpO2 100 %, unknown if currently breastfeeding.  Physical Exam:  General: alert, cooperative and no distress Lochia: appropriate Uterine Fundus: firm Incision: healing well, no significant drainage, no dehiscence, no significant erythema DVT Evaluation: No evidence of DVT seen on physical exam. Negative Homan's sign. No cords or calf tenderness.   Recent Labs  08/10/16 0750 08/11/16 0607  HGB 12.6 11.9*  HCT 37.2 35.5*    Assessment/Plan: Plan for discharge tomorrow, Breastfeeding and Circumcision prior to discharge   LOS: 1 day   Margaret [name] STACIA 08/11/2016, 8:36 AM

## 2016-08-11 NOTE — Progress Notes (Signed)
MOB was referred for history of depression/anxiety.  Referral is screened out by Clinical Social Worker because none of the following criteria appear to apply and there are no reports impacting the pregnancy or her transition to the postpartum period. CSW does not deem it clinically necessary to further investigate at this time.  -History of anxiety/depression during this pregnancy, or of post-partum depression. - Diagnosis of anxiety and/or depression within last 3 years - History of depression due to pregnancy loss/loss of child or -MOB's symptoms are currently being treated with medication and/or therapy.  CSW completed chart review to include PNC notes  for MOB's consult for hx for anxiety. This writer saw no where noted in PNC records that patient had a hx of anxiety. Please contact the Clinical Social Worker if needs arise or upon MOB request.   Artrice Kraker, MSW, LCSW-A Clinical Social Worker  [facility]  Office: [phone]  

## 2016-08-12 LAB — RH IG WORKUP (INCLUDES ABO/RH)
ABO/RH(D): B NEG
Fetal Screen: NEGATIVE
Gestational Age(Wks): 39
Unit division: 0

## 2016-08-12 NOTE — Discharge Summary (Signed)
Obstetric Discharge Summary Reason for Admission: induction of labor Prenatal Procedures: NST and ultrasound Intrapartum Procedures: spontaneous vaginal delivery Postpartum Procedures: none Complications-Operative and Postpartum: none Hemoglobin  Date Value Ref Range Status  08/11/2016 11.9 (L) 12.0 - 15.0 g/dL Final   HCT  Date Value Ref Range Status  08/11/2016 35.5 (L) 36.0 - 46.0 % Final    Physical Exam:  General: alert, cooperative and no distress Lochia: appropriate Uterine Fundus: firm perineum: healing well, no significant drainage, no dehiscence, no significant erythema DVT Evaluation: No evidence of DVT seen on physical exam. Negative Homan's sign. No cords or calf tenderness. No significant calf/ankle edema.  Discharge Diagnoses: Term Pregnancy-delivered  Discharge Information: Date: 08/12/2016 Activity: pelvic rest Diet: routine Medications: PNV and Ibuprofen Condition: stable Instructions: refer to practice specific booklet Discharge to: home   Newborn Data: Live born female  Birth Weight: 8 lb 10.6 oz (3929 g) APGAR: 8, 9  Home with mother.  Margaret [name], Margaret [name] STACIA 08/12/2016, 9:15 AM

## 2016-08-12 NOTE — Progress Notes (Signed)
Post Partum Day 1 Subjective: no complaints, up ad lib, voiding, tolerating PO, + flatus and breast feeding  Objective: Blood pressure 121/77, pulse 62, temperature 97.6 F (36.4 C), temperature source Oral, resp. rate 18, height 5\' 7"  (1.702 m), weight 100.7 kg (222 lb), last menstrual period 11/10/2015, SpO2 100 %, unknown if currently breastfeeding.  Physical Exam:  General: alert, cooperative and no distress Lochia: appropriate Uterine Fundus: firm perineum: healing well, no significant drainage, no dehiscence, no significant erythema DVT Evaluation: No evidence of DVT seen on physical exam. Negative Homan's sign. No cords or calf tenderness. No significant calf/ankle edema.   Recent Labs  08/10/16 0750 08/11/16 0607  HGB 12.6 11.9*  HCT 37.2 35.5*    Assessment/Plan: Discharge home, Breastfeeding and Circumcision done   LOS: 2 days   Margaret [name] STACIA 08/12/2016, 9:12 AM

## 2016-08-12 NOTE — Lactation Note (Signed)
This note was copied from a baby's chart. Lactation Consultation Note  Patient Name: Margaret [name] [name] [id number]'BToday's Date: 08/12/2016  Mom states breastfeeding is going well.  Reviewed basics and engorgement treatment.  Parents deny questions or concerns.  Reviewed lactation outpatient services and support and encouraged to call prn.   Maternal Data    Feeding    LATCH Score/Interventions                      Lactation Tools Discussed/Used     Consult Status      [name], Margaret Truett S 08/12/2016, 10:23 AM

## 2016-08-13 ENCOUNTER — Telehealth (HOSPITAL_COMMUNITY): Payer: Self-pay | Admitting: Lactation Services

## 2016-08-13 NOTE — Telephone Encounter (Signed)
Mother was discharged yesterday.  Her breasts are fuller and larger today.  Provided education on how breastmilk comes to volume.  Baby was circumcised yesterday and cluster fed and was fussy last night.  Reassured mother this is normal.  Mother states baby has been breastfeeding for approx 20 min per breast (40 total) per feeding.  She was unsure of how many voids/stools so encouraged her to start recording how often.  Baby did void and stool last night.  Mom encouraged to feed baby 8-12 times/24 hours and with feeding cues. Mother has 9am Peds appointment in morning.  Was wondering if she should start supplementing with formula due to fussiness last night.  Explained supply and demand and the importance of establishing her milk suppy.  Mother is interested in doing some pumping.  Left manual pump for mother since she did not get one on discharge yesterday.  Offered mother OP at 8:30 am tomorrow but it conflicts with Peds appt.  Suggest coming to support group at 11 am.  Will call her back if OP cancels today or tommorrow.

## 2016-08-14 ENCOUNTER — Ambulatory Visit: Payer: Self-pay

## 2016-08-14 NOTE — Lactation Note (Signed)
This note was copied from a baby's chart. Lactation Consult  Mother's reason for visit:  Sore nipples, help with latch Visit Type:  Outpatient Appointment Notes:  [name] now 52 days old. Mom c/o sore cracked nipples, would like help with latch Consult:  Initial Lactation Consultant:  Margaret [name]  ________________________________________________________________________  Baby's Name:  Margaret [name] Date of Birth:  08/10/2015 Pediatrician:  Margaret [name] - [facility] Gender:  female Gestational Age: [redacted]w[redacted]d (At Birth) Birth Weight:  8 lb 10.6 oz (3929 g) Weight at Discharge:                          Date of Discharge:   There were no vitals filed for this visit. Last weight taken from location outside of [facility]:  8 lb. 2.0 oz without diaper today     Location:Pediatrician's office Weight today:  8 lb. 1.9 oz/3684 gm with clean diaper  ________________________________________________________________________  Mother's Name: Margaret [name] Patient Type of delivery:  Vaginal, Spontaneous Delivery Breastfeeding Experience:  P2 - 3 months Maternal Medical Conditions:  Breast surgery age 35, lumpectomy Maternal Medications:  None reported  ________________________________________________________________________  Breastfeeding History (Post Discharge)  Frequency of breastfeeding:  6 + times per day Duration of feeding:  20-30 minutes  Patient does not supplement or pump. Has Medela PNS DEBP at home  Infant Intake and Output Assessment  Voids:  3 in 24 hrs.  Color:  Clear yellow Stools:  3-4 in 24 hrs.  Color:  Yellow  ________________________________________________________________________  Maternal Breast Assessment  Breast:  Soft, filling Nipple:  Erect, cracking noted bilateral nipples  Pain level:  2 Pain interventions:  Lanolin  _______________________________________________________________________ Feeding Assessment/Evaluation  Initial feeding  assessment:  Infant's oral assessment:  Variance. Baby has short labial frenulum along with short lingual frenulum. Baby has restricted tongue extension, decrease upward mobility and lateralization of tongue With suck exam, LC noted lower gum ridge rubbing against LC finger. Sucking callous noted along both lips.   Baby's feeding before visit was at 10:30 at [facility] office for 15 minutes.   Positioning:  Cradle changed to cross cradle for more depth Right breast  LATCH documentation:  Latch:  2 = Grasps breast easily, tongue down, lips flanged, rhythmical sucking.  Audible swallowing:  1 = A few with stimulation  Type of nipple:  2 = Everted at rest and after stimulation  Comfort (Breast/Nipple):  1 = Filling, red/small blisters or bruises, mild/mod discomfort  Hold (Positioning):  1 = Assistance needed to correctly position infant at breast and maintain latch  LATCH score:  7 Mom initially latched baby in cradle hold, LC assisted Mom with changing to cross cradle for baby to obtain more depth. Baby demonstrated good suckling bursts. Lips needed to be un-tucked with feeding. PS 1-2 with initial latch becoming less with baby nursing. Breast softening with baby nursing. Nipple bleeding observed, nipple slight crease.  Pre-feed weight:  3684 g  (8 lb. 1.9 oz.) Post-feed weight:  3710 g (8 lb. 2.9 oz.) Amount transferred:  26 ml with nursing for 20 minutes.   Offered left breast. This nipple has more cracking and is more sore than right nipple. Initiated BF using 20 nipple shield to see if this help with comfort at breast and more depth with latch.  Mom initially tried to BF in FB hold but changed to cross cradle. Baby appeared to demonstrate good suckling bursts off/on but became sleepy with this  feeding. At breast for 14 minutes, per pre/post weight check baby only transferred 4 ml at this breast. Breast still slightly full at the end of the feeding. Nipple round, no bleeding with using nipple  shield. PS 1-2. Without nipple shield Mom reported PS 3-4. Tools:  20 NS Reviewed how to apply and Mom demonstrated back, reviewed cleaning. Hand out given   Total amount transferred:  30 ml  Advised Mom that this LC feels baby short labial/lingual frenulum is causing the trauma/pain with nursing. Mom is not interested in having frenulum evaluated by oral specialist. BF was more comfortable using the nipple shield. Advised Mom to use with feedings, to be sure and look for breast milk with feedings. Did not supplement at this visit - [facility] and sleepy at the end of this visit. Plan for home; BF on demand, at least 8-12 times in 24 hours. Use 20 nipple shield to latch. Look for breast milk in nipple shield with feedings. Breast should soften with feedings.  Keep baby nursing for 15-30 minutes 1st breast then offer 2nd breast. Alternating pattern each feeding. Mom needs to pump at least 4 times/day for 15 minutes to protect milk supply. Give baby back any amount of EBM received. Use EBM/comfort gels for nipple pain. Mom using Lanolin. OP f/u with lactation scheduled for Tuesday, 08/21/16 at 2:00.

## 2016-08-20 ENCOUNTER — Telehealth (HOSPITAL_COMMUNITY): Payer: Self-pay | Admitting: Lactation Services

## 2016-08-20 NOTE — Telephone Encounter (Signed)
Mom called requesting OP appt be cancelled for tomorrow 7/10 as BF is going better. Mom requested a call back that appt has been cancelled. LM that appt was cancelled tomorrow and to call with further questions/concerns.

## 2016-11-25 ENCOUNTER — Ambulatory Visit (HOSPITAL_COMMUNITY)
Admission: EM | Admit: 2016-11-25 | Discharge: 2016-11-25 | Disposition: A | Discharge status: Home / Self Care | Payer: BLUE CROSS/BLUE SHIELD | Attending: Family Medicine | Admitting: Family Medicine

## 2016-11-25 ENCOUNTER — Encounter (HOSPITAL_COMMUNITY): Payer: Self-pay | Admitting: Emergency Medicine

## 2016-11-25 DIAGNOSIS — R509 Fever, unspecified: Secondary | ICD-10-CM | POA: Diagnosis not present

## 2016-11-25 DIAGNOSIS — J020 Streptococcal pharyngitis: Secondary | ICD-10-CM | POA: Diagnosis not present

## 2016-11-25 LAB — POCT RAPID STREP A: Streptococcus, Group A Screen (Direct): POSITIVE — AB

## 2016-11-25 MED ORDER — AMOXICILLIN 875 MG PO TABS: 875.0000 mg | ORAL_TABLET | Freq: Two times a day (BID) | ORAL | 0 refills | Status: DC

## 2016-11-25 NOTE — ED Provider Notes (Signed)
[facility]   [id number] 11/25/16 Arrival Time: 1319   SUBJECTIVE:  Margaret [name] is a 36 y.o. female who presents to the urgent care with complaint of ST onset yest associated w/BAs, fevers, hurts to swallow. This associated with fever.  Symptoms began with right neck pain 2 days ago but Much worse in her throat yesterday. Patient has a 69-month-old boy and 53-year-old girl at home.    Patient works as a Interior and spatial designer     Past Medical History:  Diagnosis Date  . Allergy   . Anemia   . Anxiety   . Headache   . Heart murmur   . Hx of varicella    Family History  Problem Relation Age of Onset  . Heart disease Father   . Diabetes Maternal Grandmother   . Heart disease Maternal Grandmother   . Cancer Maternal Grandmother        breast  . Diabetes Maternal Grandfather   . Heart disease Paternal Grandmother    Social History   Social History  . Marital status: Married    Spouse name: N/A  . Number of children: N/A  . Years of education: N/A   Occupational History  . Not on file.   Social History Main Topics  . Smoking status: Never Smoker  . Smokeless tobacco: Never Used  . Alcohol use 1.2 oz/week    2 Standard drinks or equivalent per week  . Drug use: No  . Sexual activity: Not on file   Other Topics Concern  . Not on file   Social History Narrative  . No narrative on file   No outpatient prescriptions have been marked as taking for the 11/25/16 encounter [facility] Encounter).   No Known Allergies    ROS: As per HPI, remainder of ROS negative.   OBJECTIVE:   Vitals:   11/25/16 1333  BP: (!) 141/92  Pulse: (!) 114  Resp: 16  Temp: 100.3 F (37.9 C)  TempSrc: Oral  SpO2: 99%     General appearance: alert; no distress Eyes: PERRL; EOMI; conjunctiva normal HENT: normocephalic; atraumatic; TMs normal, canal normal, external ears normal without trauma; nasal mucosa normal; oral mucosa moderately inflamed posteriorly with mild swelling  of the right tonsillar area. Neck: supple;no adenopathy Back: no CVA tenderness Extremities: no cyanosis or edema; symmetrical with no gross deformities Skin: warm and dry Neurologic: normal gait; grossly normal Psychological: alert and cooperative; normal mood and affect      Labs:  Results for orders placed or performed during the hospital encounter of 08/10/16  CBC  Result Value Ref Range   WBC 7.5 4.0 - 10.5 K/uL   RBC 4.20 3.87 - 5.11 MIL/uL   Hemoglobin 12.6 12.0 - 15.0 g/dL   HCT 40.9 81.1 - 91.4 %   MCV 88.6 78.0 - 100.0 fL   MCH 30.0 26.0 - 34.0 pg   MCHC 33.9 30.0 - 36.0 g/dL   RDW 78.2 95.6 - 21.3 %   Platelets 144 (L) 150 - 400 K/uL  RPR  Result Value Ref Range   RPR Ser Ql Non Reactive Non Reactive  CBC  Result Value Ref Range   WBC 8.9 4.0 - 10.5 K/uL   RBC 4.02 3.87 - 5.11 MIL/uL   Hemoglobin 11.9 (L) 12.0 - 15.0 g/dL   HCT 08.6 (L) 57.8 - 46.9 %   MCV 88.3 78.0 - 100.0 fL   MCH 29.6 26.0 - 34.0 pg   MCHC 33.5 30.0 - 36.0 g/dL  RDW 13.7 11.5 - 15.5 %   Platelets 116 (L) 150 - 400 K/uL  Type and screen  Result Value Ref Range   ABO/RH(D) B NEG    Antibody Screen NEG    Sample Expiration 08/13/2016   Rh IG workup (includes ABO/Rh)  Result Value Ref Range   Gestational Age(Wks) 39    ABO/RH(D) B NEG    Fetal Screen NEG    Unit Number [id number]    Blood Component Type RHIG    Unit division 00    Status of Unit ISSUED,FINAL    Transfusion Status OK TO TRANSFUSE     Labs Reviewed - No data to display  No results found.     ASSESSMENT & PLAN:  1. Strep pharyngitis     Meds ordered this encounter  Medications  . amoxicillin (AMOXIL) 875 MG tablet    Sig: Take 1 tablet (875 mg total) by mouth 2 (two) times daily.    Dispense:  20 tablet    Refill:  0    Reviewed expectations re: course of current medical issues. Questions answered. Outlined signs and symptoms indicating need for more acute intervention. Patient verbalized  understanding. After Visit Summary given.    Procedures:      Elvina Sidle, MD 11/25/16 [phone]

## 2016-11-25 NOTE — ED Triage Notes (Signed)
Pt c/o ST onset yest associated w/BAs, fevers, hurts to swallow.   A&O x4... NAD... Ambulatory

## 2018-02-12 ENCOUNTER — Ambulatory Visit (HOSPITAL_COMMUNITY)
Admission: EM | Admit: 2018-02-12 | Discharge: 2018-02-12 | Disposition: A | Discharge status: Home / Self Care | Payer: BLUE CROSS/BLUE SHIELD | Attending: Family Medicine | Admitting: Family Medicine

## 2018-02-12 ENCOUNTER — Encounter (HOSPITAL_COMMUNITY): Payer: Self-pay | Admitting: Emergency Medicine

## 2018-02-12 DIAGNOSIS — H1031 Unspecified acute conjunctivitis, right eye: Secondary | ICD-10-CM | POA: Diagnosis not present

## 2018-02-12 MED ORDER — IPRATROPIUM BROMIDE 0.06 % NA SOLN: 2.0000 | Freq: Four times a day (QID) | NASAL | 0 refills | Status: DC

## 2018-02-12 MED ORDER — OFLOXACIN 0.3 % OP SOLN: 1.0000 [drp] | Freq: Four times a day (QID) | OPHTHALMIC | 0 refills | Status: AC

## 2018-02-12 MED ORDER — FLUTICASONE PROPIONATE 50 MCG/ACT NA SUSP: 2.0000 | Freq: Every day | NASAL | 0 refills | Status: DC

## 2018-02-12 NOTE — Discharge Instructions (Signed)
Use ofloxacin  eyedrops as directed on right eye. Monitor for any worsening of symptoms, changes in vision, sensitivity to light, eye swelling, painful eye movement, follow up with ophthalmology for further evaluation.

## 2018-02-12 NOTE — ED Triage Notes (Signed)
Pt states "I think I have pink eye in my right eye". Redness, with drainage. Since last night.

## 2018-02-12 NOTE — ED Provider Notes (Signed)
[facility]    CSN: [id number] Arrival date & time: 02/12/18  1127     History   Chief Complaint Chief Complaint  Patient presents with  . Eye Problem    HPI Margaret [name] is a 38 y.o. female.   38 year old female comes in for 1 day history of right eye redness, drainage, irritation. Denies vision changes, photophobia. Denies contact lens, glasses use. States has had rhinorrhea, nasal congestion for the past month. Denies fever, chills, night sweats. Has not tried anything for the symptoms.      Past Medical History:  Diagnosis Date  . Allergy   . Anemia   . Anxiety   . Headache   . Heart murmur   . Hx of varicella     Patient Active Problem List   Diagnosis Date Noted  . [redacted] weeks gestation of pregnancy 08/10/2016    Past Surgical History:  Procedure Laterality Date  . BREAST SURGERY    . KIDNEY SURGERY     stent for infection    OB History    Gravida  2   Para  2   Term  2   Preterm      AB  0   Living  2     SAB      TAB  0   Ectopic      Multiple  0   Live Births  2            Home Medications    Prior to Admission medications   Medication Sig Start Date End Date Taking? Authorizing Provider  fluticasone (FLONASE) 50 MCG/ACT nasal spray Place 2 sprays into both nostrils daily. 02/12/18   Cathie Hoops, Wei Poplaski V, PA-C  ipratropium (ATROVENT) 0.06 % nasal spray Place 2 sprays into both nostrils 4 (four) times daily. 02/12/18   Cathie Hoops, Dallys Nowakowski V, PA-C  ofloxacin (OCUFLOX) 0.3 % ophthalmic solution Place 1 drop into the right eye 4 (four) times daily for 7 days. 02/12/18 02/19/18  Belinda Fisher, PA-C    Family History Family History  Problem Relation Age of Onset  . Heart disease Father   . Diabetes Maternal Grandmother   . Heart disease Maternal Grandmother   . Cancer Maternal Grandmother        breast  . Diabetes Maternal Grandfather   . Heart disease Paternal Grandmother     Social History Social History   Tobacco Use  . Smoking status:  Never Smoker  . Smokeless tobacco: Never Used  Substance Use Topics  . Alcohol use: Yes    Alcohol/week: 2.0 standard drinks    Types: 2 Standard drinks or equivalent per week  . Drug use: No     Allergies   Patient has no known allergies.   Review of Systems Review of Systems  Reason unable to perform ROS: See HPI as above.     Physical Exam Triage Vital Signs ED Triage Vitals  Enc Vitals Group     BP 02/12/18 1237 (!) 150/86     Pulse Rate 02/12/18 1237 82     Resp 02/12/18 1237 16     Temp 02/12/18 1237 98 F (36.7 C)     Temp src --      SpO2 02/12/18 1237 100 %     Weight --      Height --      Head Circumference --      Peak Flow --      Pain Score  02/12/18 1238 0     Pain Loc --      Pain Edu? --      Excl. in GC? --    No data found.  Updated Vital Signs BP (!) 150/86   Pulse 82   Temp 98 F (36.7 C)   Resp 16   LMP 01/14/2018   SpO2 100%   Visual Acuity Right Eye Distance:   Left Eye Distance:   Bilateral Distance:    Right Eye Near: R Near: 20/25 Left Eye Near:  L Near: 20/25 Bilateral Near:  20/25  Physical Exam Constitutional:      General: She is not in acute distress.    Appearance: She is well-developed. She is not ill-appearing, toxic-appearing or diaphoretic.  HENT:     Head: Normocephalic and atraumatic.     Right Ear: Tympanic membrane, ear canal and external ear normal. Tympanic membrane is not erythematous or bulging.     Left Ear: Tympanic membrane, ear canal and external ear normal. Tympanic membrane is not erythematous or bulging.     Nose: Nose normal.     Mouth/Throat:     Mouth: Mucous membranes are moist.     Pharynx: Oropharynx is clear. Uvula midline.  Eyes:     General: Lids are normal. Lids are everted, no foreign bodies appreciated.     Extraocular Movements: Extraocular movements intact.     Conjunctiva/sclera:     Right eye: Right conjunctiva is injected.     Left eye: Left conjunctiva is not injected.      Pupils: Pupils are equal, round, and reactive to light.     Comments: No ciliary injection. No photophobia on exam.  Neck:     Musculoskeletal: Normal range of motion and neck supple.  Neurological:     Mental Status: She is alert and oriented to person, place, and time.      UC Treatments / Results  Labs (all labs ordered are listed, but only abnormal results are displayed) Labs Reviewed - No data to display  EKG None  Radiology No results found.  Procedures Procedures (including critical care time)  Medications Ordered in UC Medications - No data to display  Initial Impression / Assessment and Plan / UC Course  I have reviewed the triage vital signs and the nursing notes.  Pertinent labs & imaging results that were available during my care of the patient were reviewed by me and considered in my medical decision making (see chart for details).    Start ofloxacin drops as directed. Patient to follow up with ophthalmology if symptoms worsens or does not improve. Return precautions given.   Final Clinical Impressions(s) / UC Diagnoses   Final diagnoses:  Acute conjunctivitis of right eye, unspecified acute conjunctivitis type    ED Prescriptions    Medication Sig Dispense Auth. Provider   ofloxacin (OCUFLOX) 0.3 % ophthalmic solution Place 1 drop into the right eye 4 (four) times daily for 7 days. 1.4 mL Miliani Deike V, PA-C   fluticasone (FLONASE) 50 MCG/ACT nasal spray Place 2 sprays into both nostrils daily. 1 g Ertha Nabor V, PA-C   ipratropium (ATROVENT) 0.06 % nasal spray Place 2 sprays into both nostrils 4 (four) times daily. 15 mL Threasa Alpha, [place] 02/12/18 1306

## 2018-03-09 ENCOUNTER — Other Ambulatory Visit: Payer: Self-pay

## 2018-03-09 ENCOUNTER — Ambulatory Visit (HOSPITAL_COMMUNITY)
Admission: EM | Admit: 2018-03-09 | Discharge: 2018-03-09 | Disposition: A | Discharge status: Home / Self Care | Payer: BLUE CROSS/BLUE SHIELD

## 2018-03-09 ENCOUNTER — Encounter (HOSPITAL_COMMUNITY): Payer: Self-pay | Admitting: Emergency Medicine

## 2018-03-09 DIAGNOSIS — R69 Illness, unspecified: Secondary | ICD-10-CM | POA: Diagnosis not present

## 2018-03-09 DIAGNOSIS — J111 Influenza due to unidentified influenza virus with other respiratory manifestations: Secondary | ICD-10-CM

## 2018-03-09 MED ORDER — HYDROCODONE-HOMATROPINE 5-1.5 MG/5ML PO SYRP: 2.5000 mL | ORAL_SOLUTION | Freq: Every day | ORAL | 0 refills | Status: AC

## 2018-03-09 NOTE — Discharge Instructions (Signed)
Take cough syrup as needed.  Please take 600 to 800 mg of ibuprofen every 8 hours as needed.

## 2018-03-09 NOTE — ED Provider Notes (Signed)
03/09/2018 4:25 PM   DOB: 1980-05-01 / MRN: [id number]  SUBJECTIVE:  Margaret [name] is a 38 y.o. female presenting for cough accompanied by fever of 103 which started 4 days ago and has since resolved.  Tells me the cough is lingering somewhat.  She had a headache.  No shortness of breath, dyspnea on exertion.  She has no chronic medical problems.  She has No Known Allergies.   She  has a past medical history of Allergy, Anemia, Anxiety, Headache, Heart murmur, and varicella.    She  reports that she has never smoked. She has never used smokeless tobacco. She reports current alcohol use of about 2.0 standard drinks of alcohol per week. She reports that she does not use drugs. She  has no history on file for sexual activity. The patient  has a past surgical history that includes Breast surgery and Kidney surgery.  Her family history includes Cancer in her maternal grandmother; Diabetes in her maternal grandfather and maternal grandmother; Heart disease in her father, maternal grandmother, and paternal grandmother.  Review of Systems  Constitutional: Negative for chills, diaphoresis and fever.  Respiratory: Negative for cough, hemoptysis, sputum production, shortness of breath and wheezing.   Cardiovascular: Negative for chest pain, orthopnea and leg swelling.  Gastrointestinal: Negative for nausea.  Skin: Negative for rash.  Neurological: Negative for dizziness.    OBJECTIVE:  BP 128/84 (BP Location: Right Arm)   Pulse 96   Temp 98.8 F (37.1 C) (Oral)   Resp 18   LMP 02/17/2018 (Exact Date)   SpO2 100%   Wt Readings from Last 3 Encounters:  08/10/16 222 lb (100.7 kg)  12/18/14 180 lb 12.8 oz (82 kg)   Temp Readings from Last 3 Encounters:  03/09/18 98.8 F (37.1 C) (Oral)  02/12/18 98 F (36.7 C)  11/25/16 100.3 F (37.9 C) (Oral)   BP Readings from Last 3 Encounters:  03/09/18 128/84  02/12/18 (!) 150/86  11/25/16 (!) 141/92   Pulse Readings from Last 3 Encounters:   03/09/18 96  02/12/18 82  11/25/16 (!) 114    Physical Exam Vitals signs reviewed.  Constitutional:      General: She is not in acute distress.    Appearance: She is not diaphoretic.  Eyes:     Pupils: Pupils are equal, round, and reactive to light.  Cardiovascular:     Rate and Rhythm: Normal rate and regular rhythm.  Pulmonary:     Effort: Pulmonary effort is normal.  Abdominal:     General: There is no distension.  Skin:    General: Skin is dry.  Neurological:     Mental Status: She is alert and oriented to person, place, and time.     Cranial Nerves: No cranial nerve deficit.     Gait: Gait normal.     No results found for this or any previous visit (from the past 72 hour(s)).  No results found.  ASSESSMENT AND PLAN:   Influenza-like illness - It is too late for Tamiflu.  Providing her with some cough syrup and I have asked that she take 600 to 800 mg of ibuprofen every 8 hours.  RTC as needed.    Discharge Instructions     Take cough syrup as needed.  Please take 600 to 800 mg of ibuprofen every 8 hours as needed.        The patient is advised to call or return to clinic if she does not see an improvement in symptoms,  or to seek the care of the closest emergency department if she worsens with the above plan.   Deliah BostonMichael Spyridon Hornstein, MHS, PA-C 03/09/2018 4:25 PM   Ofilia Neaslark, Chloe Baig L, PA-C 03/09/18 1625

## 2018-03-09 NOTE — ED Triage Notes (Signed)
The patient presented to the [facility] with a complaint of a fever earlier in the week and nausea and weakness now.

## 2019-12-27 ENCOUNTER — Other Ambulatory Visit: Payer: Self-pay

## 2019-12-27 ENCOUNTER — Ambulatory Visit (HOSPITAL_COMMUNITY)
Admission: RE | Admit: 2019-12-27 | Discharge: 2019-12-27 | Disposition: A | Discharge status: Home / Self Care | Payer: Self-pay | Source: Ambulatory Visit

## 2019-12-27 ENCOUNTER — Encounter (HOSPITAL_COMMUNITY): Payer: Self-pay

## 2019-12-27 VITALS — BP 119/83 | Temp 98.3°F | Resp 18

## 2019-12-27 DIAGNOSIS — B029 Zoster without complications: Secondary | ICD-10-CM

## 2019-12-27 MED ORDER — VALACYCLOVIR HCL 1 G PO TABS: 1000.0000 mg | ORAL_TABLET | Freq: Three times a day (TID) | ORAL | 0 refills | Status: AC

## 2019-12-27 MED ORDER — IBUPROFEN 800 MG PO TABS: 800.0000 mg | ORAL_TABLET | Freq: Three times a day (TID) | ORAL | 0 refills | Status: AC

## 2019-12-27 MED ORDER — TRIAMCINOLONE ACETONIDE 0.1 % EX CREA: 1.0000 "application " | TOPICAL_CREAM | Freq: Two times a day (BID) | CUTANEOUS | 0 refills | Status: AC

## 2019-12-27 NOTE — Discharge Instructions (Signed)
Begin Valtrex 3 times a day for 10 days Ibuprofen and Tylenol for pain May continue steroid cream, provided triamcinolone to use twice daily Follow-up if any symptoms not improving or worsening, spreading to left side

## 2019-12-27 NOTE — ED Triage Notes (Addendum)
Patient noticed rash one week ago.  Patient noticed bumps, rash to right neck, back of scalp, front and back right shoulder  Prior to rash appearance, patient had neck pain, thought to have slept on neck wrong  More itchy than painful per patient

## 2019-12-27 NOTE — ED Provider Notes (Signed)
[facility]    CSN: [id number] Arrival date & time: 12/27/19  1243      History   Chief Complaint Chief Complaint  Patient presents with  . Rash  . Appointment    1:00pm    HPI Margaret [name] is a 39 y.o. female presenting today for evaluation of her rash.  Patient reports that approximate 1 week ago she developed discomfort in the right side of her neck back and scalp, subsequently she developed rash in the same area.  Reports associated pain and itching.  Denies rash or lesions on left side.  HPI  Past Medical History:  Diagnosis Date  . Allergy   . Anemia   . Anxiety   . Headache   . Heart murmur   . Hx of varicella     Patient Active Problem List   Diagnosis Date Noted  . [redacted] weeks gestation of pregnancy 08/10/2016    Past Surgical History:  Procedure Laterality Date  . BREAST SURGERY    . KIDNEY SURGERY     stent for infection    OB History    Gravida  2   Para  2   Term  2   Preterm      AB  0   Living  2     SAB      TAB  0   Ectopic      Multiple  0   Live Births  2            Home Medications    Prior to Admission medications   Medication Sig Start Date End Date Taking? Authorizing Provider  acetaminophen (TYLENOL) 325 MG tablet Take 650 mg by mouth every 6 (six) hours as needed.   Yes [provider]  ibuprofen (ADVIL) 800 MG tablet Take 1 tablet (800 mg total) by mouth 3 (three) times daily. 12/27/19   Iverson Sees C, PA-C  triamcinolone cream (KENALOG) 0.1 % Apply 1 application topically 2 (two) times daily. 12/27/19   Klover Priestly C, PA-C  valACYclovir (VALTREX) 1000 MG tablet Take 1 tablet (1,000 mg total) by mouth 3 (three) times daily for 10 days. 12/27/19 01/06/20  Zeus Marquis, Junius Creamer, PA-C    Family History Family History  Problem Relation Age of Onset  . Heart disease Father   . Diabetes Maternal Grandmother   . Heart disease Maternal Grandmother   . Cancer Maternal Grandmother         breast  . Diabetes Maternal Grandfather   . Heart disease Paternal Grandmother     Social History Social History   Tobacco Use  . Smoking status: Never Smoker  . Smokeless tobacco: Never Used  Substance Use Topics  . Alcohol use: Yes    Alcohol/week: 2.0 standard drinks    Types: 2 Standard drinks or equivalent per week  . Drug use: No     Allergies   Patient has no known allergies.   Review of Systems Review of Systems  Constitutional: Negative for fatigue and fever.  Eyes: Negative for visual disturbance.  Respiratory: Negative for shortness of breath.   Cardiovascular: Negative for chest pain.  Gastrointestinal: Negative for abdominal pain, nausea and vomiting.  Musculoskeletal: Positive for back pain and neck pain. Negative for arthralgias and joint swelling.  Skin: Positive for color change and rash. Negative for wound.  Neurological: Negative for dizziness, weakness, light-headedness and headaches.     Physical Exam Triage Vital Signs ED Triage Vitals  Enc Vitals  Group     BP      Pulse      Resp      Temp      Temp src      SpO2      Weight      Height      Head Circumference      Peak Flow      Pain Score      Pain Loc      Pain Edu?      Excl. in GC?    No data found.  Updated Vital Signs BP 119/83 (BP Location: Left Arm)   Temp 98.3 F (36.8 C) (Oral)   Resp 18   LMP 12/12/2019   SpO2 100%   Visual Acuity Right Eye Distance:   Left Eye Distance:   Bilateral Distance:    Right Eye Near:   Left Eye Near:    Bilateral Near:     Physical Exam Vitals and nursing note reviewed.  Constitutional:      Appearance: She is well-developed.     Comments: No acute distress  HENT:     Head: Normocephalic and atraumatic.     Nose: Nose normal.  Eyes:     Conjunctiva/sclera: Conjunctivae normal.  Cardiovascular:     Rate and Rhythm: Normal rate.  Pulmonary:     Effort: Pulmonary effort is normal. No respiratory distress.  Abdominal:      General: There is no distension.  Musculoskeletal:        General: Normal range of motion.     Cervical back: Neck supple.  Skin:    General: Skin is warm and dry.     Comments: Clustered fluid-filled vesicles on erythematous base noted in areas to right upper thoracic area, right nape of the neck as well as right anterior upper chest area, some lesions scabbed  Neurological:     Mental Status: She is alert and oriented to person, place, and time.      UC Treatments / Results  Labs (all labs ordered are listed, but only abnormal results are displayed) Labs Reviewed - No data to display  EKG   Radiology No results found.  Procedures Procedures (including critical care time)  Medications Ordered in UC Medications - No data to display  Initial Impression / Assessment and Plan / UC Course  I have reviewed the triage vital signs and the nursing notes.  Pertinent labs & imaging results that were available during my care of the patient were reviewed by me and considered in my medical decision making (see chart for details).     Rash suggestive of shingles, initiated on Valtrex, Tylenol and ibuprofen for pain, symptomatic and supportive care and monitor for gradual resolution.  Discussed strict return precautions. Patient verbalized understanding and is agreeable with plan.  Final Clinical Impressions(s) / UC Diagnoses   Final diagnoses:  Herpes zoster without complication     Discharge Instructions     Begin Valtrex 3 times a day for 10 days Ibuprofen and Tylenol for pain May continue steroid cream, provided triamcinolone to use twice daily Follow-up if any symptoms not improving or worsening, spreading to left side    ED Prescriptions    Medication Sig Dispense Auth. Provider   valACYclovir (VALTREX) 1000 MG tablet Take 1 tablet (1,000 mg total) by mouth 3 (three) times daily for 10 days. 30 tablet Caralina Nop C, PA-C   ibuprofen (ADVIL) 800 MG tablet Take 1  tablet (800 mg  total) by mouth 3 (three) times daily. 21 tablet Clebert Wenger C, PA-C   triamcinolone cream (KENALOG) 0.1 % Apply 1 application topically 2 (two) times daily. 30 g Nikitas Davtyan, Lula C, PA-C     PDMP not reviewed this encounter.   Lew Dawes, [place] 12/27/19 1417

## 2020-09-30 ENCOUNTER — Ambulatory Visit: Payer: Self-pay

## 2020-10-02 ENCOUNTER — Ambulatory Visit (HOSPITAL_COMMUNITY)
Admission: RE | Admit: 2020-10-02 | Discharge: 2020-10-02 | Disposition: A | Discharge status: Home / Self Care | Payer: Self-pay | Source: Ambulatory Visit | Attending: Internal Medicine | Admitting: Internal Medicine

## 2020-10-02 ENCOUNTER — Other Ambulatory Visit: Payer: Self-pay

## 2020-10-02 ENCOUNTER — Encounter (HOSPITAL_COMMUNITY): Payer: Self-pay

## 2020-10-02 VITALS — BP 129/79 | HR 108 | Temp 98.3°F | Resp 16

## 2020-10-02 DIAGNOSIS — Z20822 Contact with and (suspected) exposure to covid-19: Secondary | ICD-10-CM | POA: Insufficient documentation

## 2020-10-02 DIAGNOSIS — J029 Acute pharyngitis, unspecified: Secondary | ICD-10-CM | POA: Insufficient documentation

## 2020-10-02 LAB — POCT RAPID STREP A, ED / UC: Streptococcus, Group A Screen (Direct): NEGATIVE

## 2020-10-02 NOTE — ED Provider Notes (Signed)
[facility]    CSN: [id number] Arrival date & time: 10/02/20  1055      History   Chief Complaint Chief Complaint  Patient presents with   Sore Throat    APPT   Generalized Body Aches   Fever    HPI Margaret [name] is a 40 y.o. female.   Patient here for ration of fever, sore throat, and body aches that have been ongoing for the past 4 days.  Reports taking Tylenol and ibuprofen with minimal relief.  Reports throat pain has gotten progressively worse and it is painful to swallow.  Patient managing secretions at this time.  Denies any trauma, injury, or other precipitating event.  Denies any fevers, chest pain, shortness of breath, N/V/D, numbness, tingling, weakness, abdominal pain, or headaches.    The history is provided by the patient.  Sore Throat  Fever Associated symptoms: sore throat    Past Medical History:  Diagnosis Date   Allergy    Anemia    Anxiety    Headache    Heart murmur    Hx of varicella     Patient Active Problem List   Diagnosis Date Noted   [redacted] weeks gestation of pregnancy 08/10/2016    Past Surgical History:  Procedure Laterality Date   BREAST SURGERY     KIDNEY SURGERY     stent for infection    OB History     Gravida  2   Para  2   Term  2   Preterm      AB  0   Living  2      SAB      IAB  0   Ectopic      Multiple  0   Live Births  2            Home Medications    Prior to Admission medications   Medication Sig Start Date End Date Taking? Authorizing Provider  acetaminophen (TYLENOL) 325 MG tablet Take 650 mg by mouth every 6 (six) hours as needed.    [provider]  ibuprofen (ADVIL) 800 MG tablet Take 1 tablet (800 mg total) by mouth 3 (three) times daily. 12/27/19   Wieters, Hallie C, PA-C  triamcinolone cream (KENALOG) 0.1 % Apply 1 application topically 2 (two) times daily. 12/27/19   Wieters, Junius Creamer, PA-C    Family History Family History  Problem Relation Age of Onset    Heart disease Father    Diabetes Maternal Grandmother    Heart disease Maternal Grandmother    Cancer Maternal Grandmother        breast   Diabetes Maternal Grandfather    Heart disease Paternal Grandmother     Social History Social History   Tobacco Use   Smoking status: Never   Smokeless tobacco: Never  Substance Use Topics   Alcohol use: Yes    Alcohol/week: 2.0 standard drinks    Types: 2 Standard drinks or equivalent per week   Drug use: No     Allergies   Patient has no known allergies.   Review of Systems Review of Systems  Constitutional:  Positive for fever.  HENT:  Positive for sore throat and trouble swallowing.   All other systems reviewed and are negative.   Physical Exam Triage Vital Signs ED Triage Vitals  Enc Vitals Group     BP 10/02/20 1117 129/79     Pulse Rate 10/02/20 1117 (!) 108  Resp 10/02/20 1117 16     Temp 10/02/20 1117 98.3 F (36.8 C)     Temp Source 10/02/20 1117 Oral     SpO2 10/02/20 1117 100 %     Weight --      Height --      Head Circumference --      Peak Flow --      Pain Score 10/02/20 1115 5     Pain Loc --      Pain Edu? --      Excl. in GC? --    No data found.  Updated Vital Signs BP 129/79 (BP Location: Left Arm)   Pulse (!) 108   Temp 98.3 F (36.8 C) (Oral)   Resp 16   LMP 09/30/2020   SpO2 100%   Visual Acuity Right Eye Distance:   Left Eye Distance:   Bilateral Distance:    Right Eye Near:   Left Eye Near:    Bilateral Near:     Physical Exam Vitals and nursing note reviewed.  Constitutional:      General: She is not in acute distress.    Appearance: Normal appearance. She is not ill-appearing, toxic-appearing or diaphoretic.  HENT:     Head: Normocephalic and atraumatic.     Mouth/Throat:     Pharynx: Pharyngeal swelling and posterior oropharyngeal erythema present. No oropharyngeal exudate or uvula swelling.     Tonsils: No tonsillar exudate or tonsillar abscesses. 2+ on the right.  2+ on the left.  Eyes:     Conjunctiva/sclera: Conjunctivae normal.  Cardiovascular:     Rate and Rhythm: Normal rate and regular rhythm.     Pulses: Normal pulses.     Heart sounds: Normal heart sounds.  Pulmonary:     Effort: Pulmonary effort is normal.     Breath sounds: Normal breath sounds.  Abdominal:     General: Abdomen is flat.  Musculoskeletal:        General: Normal range of motion.     Cervical back: Normal range of motion.  Skin:    General: Skin is warm and dry.  Neurological:     General: No focal deficit present.     Mental Status: She is alert and oriented to person, place, and time.  Psychiatric:        Mood and Affect: Mood normal.     UC Treatments / Results  Labs (all labs ordered are listed, but only abnormal results are displayed) Labs Reviewed  CULTURE, GROUP A STREP ([facility])  SARS CORONAVIRUS 2 (TAT 6-24 HRS)  POCT RAPID STREP A, ED / UC    EKG   Radiology No results found.  Procedures Procedures (including critical care time)  Medications Ordered in UC Medications - No data to display  Initial Impression / Assessment and Plan / UC Course  I have reviewed the triage vital signs and the nursing notes.  Pertinent labs & imaging results that were available during my care of the patient were reviewed by me and considered in my medical decision making (see chart for details).    Assessment negative for red flags or concerns.  Rapid strep negative, throat culture pending.  COVID test pending.  Likely viral pharyngitis.  Recommend Tylenol and ibuprofen as needed.  Discussed conservative symptom management as described in discharge instructions.  Follow-up as needed Final Clinical Impressions(s) / UC Diagnoses   Final diagnoses:  Viral pharyngitis     Discharge Instructions      We  will contact you if your COVID test is positive.    You can take Tylenol and/or Ibuprofen as needed for fever reduction and pain relief.   For cough: honey  1/2 to 1 teaspoon (you can dilute the honey in water or another fluid).  You can also use guaifenesin and dextromethorphan for cough. You can use a humidifier for chest congestion and cough.  If you don't have a humidifier, you can sit in the bathroom with the hot shower running.     For sore throat: try warm salt water gargles, cepacol lozenges, throat spray, warm tea or water with lemon/honey, popsicles or ice, or OTC cold relief medicine for throat discomfort.    For congestion: take a daily anti-histamine like Zyrtec, Claritin, and a oral decongestant, such as pseudoephedrine.  You can also use Flonase 1-2 sprays in each nostril daily.    It is important to stay hydrated: drink plenty of fluids (water, gatorade/powerade/pedialyte, juices, or teas) to keep your throat moisturized and help further relieve irritation/discomfort.   Return or go to the Emergency Department if symptoms worsen or do not improve in the next few days.      ED Prescriptions   None    PDMP not reviewed this encounter.   Ivette Loyal, NP 10/02/20 1150

## 2020-10-02 NOTE — ED Triage Notes (Signed)
Pt presents with fever, sore throat, and body aches xs 4 days.

## 2020-10-02 NOTE — Discharge Instructions (Addendum)
We will contact you if your COVID test is positive.   You can take Tylenol and/or Ibuprofen as needed for fever reduction and pain relief.   For cough: honey 1/2 to 1 teaspoon (you can dilute the honey in water or another fluid).  You can also use guaifenesin and dextromethorphan for cough. You can use a humidifier for chest congestion and cough.  If you don't have a humidifier, you can sit in the bathroom with the hot shower running.     For sore throat: try warm salt water gargles, cepacol lozenges, throat spray, warm tea or water with lemon/honey, popsicles or ice, or OTC cold relief medicine for throat discomfort.    For congestion: take a daily anti-histamine like Zyrtec, Claritin, and a oral decongestant, such as pseudoephedrine.  You can also use Flonase 1-2 sprays in each nostril daily.    It is important to stay hydrated: drink plenty of fluids (water, gatorade/powerade/pedialyte, juices, or teas) to keep your throat moisturized and help further relieve irritation/discomfort.   Return or go to the Emergency Department if symptoms worsen or do not improve in the next few days.  

## 2020-10-03 LAB — SARS CORONAVIRUS 2 (TAT 6-24 HRS): SARS Coronavirus 2: NEGATIVE

## 2020-10-05 LAB — CULTURE, GROUP A STREP (THRC)

## 2022-05-07 ENCOUNTER — Encounter: Payer: Medicaid Other | Admitting: Family Medicine

## 2022-06-18 ENCOUNTER — Other Ambulatory Visit (HOSPITAL_COMMUNITY)
Admission: RE | Admit: 2022-06-18 | Discharge: 2022-06-18 | Disposition: A | Discharge status: Home / Self Care | Payer: Medicaid Other | Source: Ambulatory Visit | Attending: Family Medicine | Admitting: Family Medicine

## 2022-06-18 ENCOUNTER — Ambulatory Visit (INDEPENDENT_AMBULATORY_CARE_PROVIDER_SITE_OTHER): Payer: Medicaid Other | Admitting: Obstetrics and Gynecology

## 2022-06-18 ENCOUNTER — Encounter: Payer: Self-pay | Admitting: Obstetrics and Gynecology

## 2022-06-18 VITALS — BP 126/79 | HR 80 | Ht 67.0 in | Wt 201.0 lb

## 2022-06-18 DIAGNOSIS — Z01419 Encounter for gynecological examination (general) (routine) without abnormal findings: Secondary | ICD-10-CM | POA: Diagnosis not present

## 2022-06-18 NOTE — Progress Notes (Signed)
Margaret [name] is a 42 y.o. [phone] female here for a routine annual gynecologic exam.  Current complaints: None.   Denies abnormal vaginal bleeding, discharge, pelvic pain, problems with intercourse or other gynecologic concerns.    Gynecologic History No LMP recorded. Contraception: none Last Pap: Yrs ago. Results were: normal Last mammogram: NA.   Obstetric History OB History  Gravida Para Term Preterm AB Living  2 2 2    0 2  SAB IAB Ectopic Multiple Live Births    0   0 2    # Outcome Date GA Lbr Len/2nd Weight Sex Delivery Anes PTL Lv  2 Term 08/10/16 [redacted]w[redacted]d 04:25 / 00:12 8 lb 10.6 oz (3.929 kg) M Vag-Spont EPI  LIV  1 Term 2012 [redacted]w[redacted]d  8 lb 13 oz (3.997 kg) F Vag-Spont   LIV    Past Medical History:  Diagnosis Date   Allergy    Anemia    Anxiety    Headache    Heart murmur    Hx of varicella     Past Surgical History:  Procedure Laterality Date   BREAST SURGERY     KIDNEY SURGERY     stent for infection    Current Outpatient Medications on File Prior to Visit  Medication Sig Dispense Refill   acetaminophen (TYLENOL) 325 MG tablet Take 650 mg by mouth every 6 (six) hours as needed.     ibuprofen (ADVIL) 800 MG tablet Take 1 tablet (800 mg total) by mouth 3 (three) times daily. 21 tablet 0   triamcinolone cream (KENALOG) 0.1 % Apply 1 application topically 2 (two) times daily. 30 g 0   No current facility-administered medications on file prior to visit.    No Known Allergies  Social History   Socioeconomic History   Marital status: Married    Spouse name: Not on file   Number of children: 2   Years of education: Not on file   Highest education level: Not on file  Occupational History   Not on file  Tobacco Use   Smoking status: Never   Smokeless tobacco: Never  Vaping Use   Vaping Use: Never used  Substance and Sexual Activity   Alcohol use: Yes    Alcohol/week: 2.0 standard drinks of alcohol    Types: 2 Standard drinks or equivalent per week   Drug  use: No   Sexual activity: Yes    Birth control/protection: None  Other Topics Concern   Not on file  Social History Narrative   Not on file   Social Determinants of Health   Financial Resource Strain: Not on file  Food Insecurity: Not on file  Transportation Needs: Not on file  Physical Activity: Not on file  Stress: Not on file  Social Connections: Not on file  Intimate Partner Violence: Not on file    Family History  Problem Relation Age of Onset   Heart disease Father    Cancer Maternal Aunt    Diabetes Maternal Grandmother    Heart disease Maternal Grandmother    Cancer Maternal Grandmother        breast   Diabetes Maternal Grandfather    Heart disease Paternal Grandmother     The following portions of the patient's history were reviewed and updated as appropriate: allergies, current medications, past family history, past medical history, past social history, past surgical history and problem list.  Review of Systems Pertinent items noted in HPI and remainder of comprehensive ROS otherwise negative.   Objective:  BP 126/79   Pulse 80   Ht 5\' 7"  (1.702 m)   Wt 201 lb (91.2 kg)   BMI 31.48 kg/m  Chaperone present CONSTITUTIONAL: Well-developed, well-nourished female in no acute distress.  HENT:  Normocephalic, atraumatic, External right and left ear normal. Oropharynx is clear and moist EYES: Conjunctivae and EOM are normal. Pupils are equal, round, and reactive to light. No scleral icterus.  NECK: Normal range of motion, supple, no masses.  Normal thyroid.  SKIN: Skin is warm and dry. No rash noted. Not diaphoretic. No erythema. No pallor. NEUROLGIC: Alert and oriented to person, place, and time. Normal reflexes, muscle tone coordination. No cranial nerve deficit noted. PSYCHIATRIC: Normal mood and affect. Normal behavior. Normal judgment and thought content. CARDIOVASCULAR: Normal heart rate noted, regular rhythm RESPIRATORY: Clear to auscultation bilaterally.  Effort and breath sounds normal, no problems with respiration noted. BREASTS: Symmetric in size. No masses, skin changes, nipple drainage, or lymphadenopathy. ABDOMEN: Soft, normal bowel sounds, no distention noted.  No tenderness, rebound or guarding.  PELVIC: Normal appearing external genitalia; normal appearing vaginal mucosa and cervix.  No abnormal discharge noted.  Pap smear obtained.  Normal uterine size, no other palpable masses, no uterine or adnexal tenderness. MUSCULOSKELETAL: Normal range of motion. No tenderness.  No cyanosis, clubbing, or edema.  2+ distal pulses.   Assessment:  Annual gynecologic examination with pap smear   Plan:  Will follow up results of pap smear and manage accordingly. Mammogram scheduled Routine preventative health maintenance measures emphasized. Please refer to After Visit Summary for other counseling recommendations.    Hermina Staggers, MD, FACOG Attending Obstetrician & Gynecologist Center for [facility], [facility]

## 2022-06-18 NOTE — Progress Notes (Signed)
42 y.o. New GYN presents for AEX/PAP.  Declined STD screening.  Needs first Mammogram.  C/o periods being shorter and lighter.

## 2022-06-20 LAB — CYTOLOGY - PAP
Comment: NEGATIVE
Diagnosis: NEGATIVE
High risk HPV: NEGATIVE

## 2022-06-22 NOTE — Progress Notes (Signed)
TC. Advised of normal results.

## 2022-07-01 ENCOUNTER — Ambulatory Visit (HOSPITAL_BASED_OUTPATIENT_CLINIC_OR_DEPARTMENT_OTHER)
Admission: RE | Admit: 2022-07-01 | Discharge: 2022-07-01 | Disposition: A | Discharge status: Home / Self Care | Payer: Medicaid Other | Source: Ambulatory Visit | Attending: Obstetrics and Gynecology | Admitting: Obstetrics and Gynecology

## 2022-07-01 DIAGNOSIS — Z1231 Encounter for screening mammogram for malignant neoplasm of breast: Secondary | ICD-10-CM | POA: Diagnosis not present

## 2022-07-01 DIAGNOSIS — Z01419 Encounter for gynecological examination (general) (routine) without abnormal findings: Secondary | ICD-10-CM | POA: Diagnosis not present

## 2022-07-04 ENCOUNTER — Other Ambulatory Visit: Payer: Self-pay | Admitting: Obstetrics and Gynecology

## 2022-07-04 DIAGNOSIS — R928 Other abnormal and inconclusive findings on diagnostic imaging of breast: Secondary | ICD-10-CM

## 2022-07-16 ENCOUNTER — Other Ambulatory Visit: Payer: Medicaid Other

## 2022-08-20 ENCOUNTER — Ambulatory Visit: Payer: Medicaid Other

## 2022-08-20 ENCOUNTER — Ambulatory Visit
Admission: RE | Admit: 2022-08-20 | Discharge: 2022-08-20 | Disposition: A | Discharge status: Home / Self Care | Payer: Medicaid Other | Source: Ambulatory Visit | Attending: Obstetrics and Gynecology | Admitting: Obstetrics and Gynecology

## 2022-08-20 DIAGNOSIS — R928 Other abnormal and inconclusive findings on diagnostic imaging of breast: Secondary | ICD-10-CM | POA: Diagnosis not present

## 2022-11-11 ENCOUNTER — Ambulatory Visit
Admission: EM | Admit: 2022-11-11 | Discharge: 2022-11-11 | Disposition: A | Discharge status: Home / Self Care | Payer: Medicaid Other | Attending: Physician Assistant | Admitting: Physician Assistant

## 2022-11-11 DIAGNOSIS — H938X1 Other specified disorders of right ear: Secondary | ICD-10-CM

## 2022-11-11 DIAGNOSIS — H6991 Unspecified Eustachian tube disorder, right ear: Secondary | ICD-10-CM

## 2022-11-11 MED ORDER — FLUTICASONE PROPIONATE 50 MCG/ACT NA SUSP: 1.0000 | Freq: Every day | NASAL | 0 refills | Status: AC

## 2022-11-11 NOTE — Discharge Instructions (Signed)
There is no evidence of an infection.  I believe you have eustachian tube dysfunction.  Continue your allergy medication as previously prescribed.  Start fluticasone nasal spray daily.  Continue with nasal saline/sinus rinses for additional symptom relief.  If your symptoms are not improving please follow-up with ENT.  If anything worsens and you have ear pain, fever, drainage from the ear you should be seen immediately.

## 2022-11-11 NOTE — ED Provider Notes (Signed)
[facility]    CSN: [id number] Arrival date & time: 11/11/22  1150      History   Chief Complaint Chief Complaint  Patient presents with   Ear Fullness    Right    HPI Margaret [name] is a 42 y.o. female.   Patient presents today with a several day history of right ear fullness.  She denies any associated otalgia, otorrhea, fever, nausea, vomiting.  She was experiencing nasal congestion and allergy symptoms but started taking her allergy medication including Tylenol Cold and flu and had resolution of the symptoms.  She feels that her hearing is muffled on the side but she denies any significant pain.  She denies any recent swimming or airplane travel.  She does not follow with an ENT.  She has not been using Q-tips or anything else in the ear recently.  Denies any recent antibiotics or steroids.  No concern for pregnancy.    Past Medical History:  Diagnosis Date   Allergy    Anemia    Anxiety    Headache    Heart murmur    Hx of varicella     Patient Active Problem List   Diagnosis Date Noted   Visit for routine gyn exam 06/18/2022    Past Surgical History:  Procedure Laterality Date   BREAST SURGERY     KIDNEY SURGERY     stent for infection    OB History     Gravida  2   Para  2   Term  2   Preterm      AB  0   Living  2      SAB      IAB  0   Ectopic      Multiple  0   Live Births  2            Home Medications    Prior to Admission medications   Medication Sig Start Date End Date Taking? Authorizing Provider  fluticasone (FLONASE) 50 MCG/ACT nasal spray Place 1 spray into both nostrils daily. 11/11/22  Yes Lenay Lovejoy, Noberto Retort, PA-C  acetaminophen (TYLENOL) 325 MG tablet Take 650 mg by mouth every 6 (six) hours as needed.    [provider]  ibuprofen (ADVIL) 800 MG tablet Take 1 tablet (800 mg total) by mouth 3 (three) times daily. 12/27/19   Wieters, Hallie C, PA-C  triamcinolone cream (KENALOG) 0.1 % Apply 1  application topically 2 (two) times daily. 12/27/19   Wieters, Junius Creamer, PA-C    Family History Family History  Problem Relation Age of Onset   Heart disease Father    Cancer Maternal Aunt    Diabetes Maternal Grandmother    Heart disease Maternal Grandmother    Cancer Maternal Grandmother        breast   Diabetes Maternal Grandfather    Heart disease Paternal Grandmother     Social History Social History   Tobacco Use   Smoking status: Never   Smokeless tobacco: Never  Vaping Use   Vaping status: Never Used  Substance Use Topics   Alcohol use: Yes    Alcohol/week: 2.0 standard drinks of alcohol    Types: 2 Standard drinks or equivalent per week    Comment: Occassionally.   Drug use: No     Allergies   Patient has no known allergies.   Review of Systems Review of Systems  Constitutional:  Negative for activity change, appetite change, fatigue and fever.  HENT:  Positive for hearing loss (Muffled on right). Negative for congestion (Improved), ear discharge, ear pain (Fullness but no pain), sinus pressure, sneezing and sore throat.   Respiratory:  Negative for cough.   Cardiovascular:  Negative for chest pain.  Gastrointestinal:  Negative for abdominal pain, diarrhea, nausea and vomiting.     Physical Exam Triage Vital Signs ED Triage Vitals  Encounter Vitals Group     BP 11/11/22 1200 113/71     Systolic BP Percentile --      Diastolic BP Percentile --      Pulse Rate 11/11/22 1200 72     Resp 11/11/22 1200 18     Temp 11/11/22 1200 98.2 F (36.8 C)     Temp Source 11/11/22 1200 Oral     SpO2 11/11/22 1200 97 %     Weight 11/11/22 1159 200 lb (90.7 kg)     Height 11/11/22 1159 5\' 7"  (1.702 m)     Head Circumference --      Peak Flow --      Pain Score 11/11/22 1157 0     Pain Loc --      Pain Education --      Exclude from Growth Chart --    No data found.  Updated Vital Signs BP 113/71 (BP Location: Right Arm)   Pulse 72   Temp 98.2 F (36.8  C) (Oral)   Resp 18   Ht 5\' 7"  (1.702 m)   Wt 200 lb (90.7 kg)   LMP 10/14/2022 (Exact Date)   SpO2 97%   BMI 31.32 kg/m   Visual Acuity Right Eye Distance:   Left Eye Distance:   Bilateral Distance:    Right Eye Near:   Left Eye Near:    Bilateral Near:     Physical Exam Vitals reviewed.  Constitutional:      General: She is awake. She is not in acute distress.    Appearance: Normal appearance. She is well-developed. She is not ill-appearing.  HENT:     Head: Normocephalic and atraumatic.     Right Ear: Ear canal and external ear normal. A middle ear effusion is present. Tympanic membrane is not erythematous or bulging.     Left Ear: Tympanic membrane, ear canal and external ear normal. Tympanic membrane is not erythematous or bulging.     Nose:     Right Sinus: No maxillary sinus tenderness or frontal sinus tenderness.     Left Sinus: No maxillary sinus tenderness or frontal sinus tenderness.     Mouth/Throat:     Pharynx: Uvula midline. No oropharyngeal exudate or posterior oropharyngeal erythema.  Cardiovascular:     Rate and Rhythm: Normal rate and regular rhythm.     Heart sounds: Normal heart sounds, S1 normal and S2 normal. No murmur heard. Pulmonary:     Effort: Pulmonary effort is normal.     Breath sounds: Normal breath sounds. No wheezing, rhonchi or rales.     Comments: Clear to auscultation bilaterally Psychiatric:        Behavior: Behavior is cooperative.      UC Treatments / Results  Labs (all labs ordered are listed, but only abnormal results are displayed) Labs Reviewed - No data to display  EKG   Radiology No results found.  Procedures Procedures (including critical care time)  Medications Ordered in UC Medications - No data to display  Initial Impression / Assessment and Plan / UC Course  I have reviewed the triage  vital signs and the nursing notes.  Pertinent labs & imaging results that were available during my care of the patient  were reviewed by me and considered in my medical decision making (see chart for details).     Patient is well-appearing, afebrile, nontoxic, nontachycardic.  No evidence of acute infection on physical exam that would warrant initiation of antibiotics.  Suspect eustachian tube dysfunction as etiology of symptoms.  She was encouraged to continue allergy medication over-the-counter as well as add fluticasone to her routine.  She can also use nasal saline/sinus rinses for additional symptom relief.  If her symptoms are not improving within a week she is to follow-up with ENT and was given contact information for local provider.  We discussed that if she has any worsening or changing symptoms including development of pain, otorrhea, fever, nausea, vomiting she needs to be seen immediately.  Strict return precautions given.  All questions answered to patient satisfaction.  Final Clinical Impressions(s) / UC Diagnoses   Final diagnoses:  Dysfunction of right eustachian tube  Ear fullness, right     Discharge Instructions      There is no evidence of an infection.  I believe you have eustachian tube dysfunction.  Continue your allergy medication as previously prescribed.  Start fluticasone nasal spray daily.  Continue with nasal saline/sinus rinses for additional symptom relief.  If your symptoms are not improving please follow-up with ENT.  If anything worsens and you have ear pain, fever, drainage from the ear you should be seen immediately.     ED Prescriptions     Medication Sig Dispense Auth. Provider   fluticasone (FLONASE) 50 MCG/ACT nasal spray Place 1 spray into both nostrils daily. 16 g Janace Decker K, PA-C      PDMP not reviewed this encounter.   Jeani Hawking, PA-C 11/11/22 1218

## 2022-11-11 NOTE — ED Triage Notes (Signed)
"  Earlier in the week having bad allergy attack, 2-3 days later started with right ear feeling like it is stopped up". No injury known. Some decreased hearing. No fever. No discharge from ear.

## 2023-01-21 ENCOUNTER — Ambulatory Visit
Admission: EM | Admit: 2023-01-21 | Discharge: 2023-01-21 | Disposition: A | Discharge status: Home / Self Care | Payer: Medicaid Other | Attending: Physician Assistant | Admitting: Physician Assistant

## 2023-01-21 DIAGNOSIS — R399 Unspecified symptoms and signs involving the genitourinary system: Secondary | ICD-10-CM | POA: Insufficient documentation

## 2023-01-21 DIAGNOSIS — R350 Frequency of micturition: Secondary | ICD-10-CM | POA: Insufficient documentation

## 2023-01-21 LAB — POCT URINALYSIS DIP (MANUAL ENTRY)
Bilirubin, UA: NEGATIVE
Glucose, UA: NEGATIVE mg/dL
Ketones, POC UA: NEGATIVE mg/dL
Leukocytes, UA: NEGATIVE
Nitrite, UA: NEGATIVE
Protein Ur, POC: NEGATIVE mg/dL
Spec Grav, UA: 1.015 (ref 1.010–1.025)
Urobilinogen, UA: 0.2 U/dL
pH, UA: 7 (ref 5.0–8.0)

## 2023-01-21 LAB — POCT URINE PREGNANCY: Preg Test, Ur: NEGATIVE

## 2023-01-21 MED ORDER — CEPHALEXIN 500 MG PO CAPS: 500.0000 mg | ORAL_CAPSULE | Freq: Two times a day (BID) | ORAL | 0 refills | Status: AC

## 2023-01-21 NOTE — ED Provider Notes (Signed)
[facility]    CSN: [id number] Arrival date & time: 01/21/23  1251      History   Chief Complaint No chief complaint on file.   HPI Denetrice Starck is a 42 y.o. female.   Patient presents today with a 2-day history of UTI symptoms.  She reports initially having some dysuria but has been pushing fluid and taking Azo and this has improved.  She continues to have some fullness in her suprapubic region as well as frequency and urgency.  She does have a history of UTI but has not had this anytime recently.  Symptoms have gotten severe in the past and she had pyelonephritis requiring stent so she is anxious to ensure that this is not an infection.  She denies any fever, abdominal pain, nausea, vomiting, vaginal symptoms.  She denies history of diabetes does not take SGLT2 inhibitor.  She is no concern for pregnancy.  She denies any recent antibiotics.  Denies history of single kidney, recent urogenital procedure, catheterization.    Past Medical History:  Diagnosis Date   Allergy    Anemia    Anxiety    Headache    Heart murmur    Hx of varicella     Patient Active Problem List   Diagnosis Date Noted   Visit for routine gyn exam 06/18/2022    Past Surgical History:  Procedure Laterality Date   BREAST SURGERY     KIDNEY SURGERY     stent for infection    OB History     Gravida  2   Para  2   Term  2   Preterm      AB  0   Living  2      SAB      IAB  0   Ectopic      Multiple  0   Live Births  2            Home Medications    Prior to Admission medications   Medication Sig Start Date End Date Taking? Authorizing Provider  cephALEXin (KEFLEX) 500 MG capsule Take 1 capsule (500 mg total) by mouth 2 (two) times daily for 5 days. 01/21/23 01/26/23 Yes Zaeem Kandel, Noberto Retort, PA-C  acetaminophen (TYLENOL) 325 MG tablet Take 650 mg by mouth every 6 (six) hours as needed.    [provider]  fluticasone (FLONASE) 50 MCG/ACT nasal spray Place  1 spray into both nostrils daily. 11/11/22   Demond Shallenberger, Noberto Retort, PA-C  ibuprofen (ADVIL) 800 MG tablet Take 1 tablet (800 mg total) by mouth 3 (three) times daily. 12/27/19   Wieters, Hallie C, PA-C  triamcinolone cream (KENALOG) 0.1 % Apply 1 application topically 2 (two) times daily. 12/27/19   Wieters, Junius Creamer, PA-C    Family History Family History  Problem Relation Age of Onset   Heart disease Father    Cancer Maternal Aunt    Diabetes Maternal Grandmother    Heart disease Maternal Grandmother    Cancer Maternal Grandmother        breast   Diabetes Maternal Grandfather    Heart disease Paternal Grandmother     Social History Social History   Tobacco Use   Smoking status: Never   Smokeless tobacco: Never  Vaping Use   Vaping status: Never Used  Substance Use Topics   Alcohol use: Yes    Alcohol/week: 2.0 standard drinks of alcohol    Types: 2 Standard drinks or equivalent per week  Comment: Occassionally.   Drug use: No     Allergies   Patient has no known allergies.   Review of Systems Review of Systems  Constitutional:  Positive for activity change. Negative for appetite change, fatigue and fever.  Gastrointestinal:  Negative for abdominal pain, diarrhea, nausea and vomiting.  Genitourinary:  Positive for dysuria, frequency and urgency. Negative for flank pain, pelvic pain, vaginal bleeding, vaginal discharge and vaginal pain.     Physical Exam Triage Vital Signs ED Triage Vitals  Encounter Vitals Group     BP 01/21/23 1359 130/85     Systolic BP Percentile --      Diastolic BP Percentile --      Pulse Rate 01/21/23 1359 61     Resp 01/21/23 1359 16     Temp 01/21/23 1359 98.5 F (36.9 C)     Temp Source 01/21/23 1359 Oral     SpO2 01/21/23 1359 98 %     Weight 01/21/23 1400 200 lb (90.7 kg)     Height 01/21/23 1400 5\' 7"  (1.702 m)     Head Circumference --      Peak Flow --      Pain Score 01/21/23 1358 1     Pain Loc --      Pain Education --       Exclude from Growth Chart --    No data found.  Updated Vital Signs BP 130/85 (BP Location: Right Arm)   Pulse 61   Temp 98.5 F (36.9 C) (Oral)   Resp 16   Ht 5\' 7"  (1.702 m)   Wt 200 lb (90.7 kg)   LMP 01/11/2023   SpO2 98%   BMI 31.32 kg/m   Visual Acuity Right Eye Distance:   Left Eye Distance:   Bilateral Distance:    Right Eye Near:   Left Eye Near:    Bilateral Near:     Physical Exam Vitals reviewed.  Constitutional:      General: She is awake. She is not in acute distress.    Appearance: Normal appearance. She is well-developed. She is not ill-appearing.     Comments: Very pleasant female appears stated age in no acute distress sitting comfortably in exam room  HENT:     Head: Normocephalic and atraumatic.  Cardiovascular:     Rate and Rhythm: Normal rate and regular rhythm.     Heart sounds: Normal heart sounds, S1 normal and S2 normal. No murmur heard. Pulmonary:     Effort: Pulmonary effort is normal.     Breath sounds: Normal breath sounds. No wheezing, rhonchi or rales.     Comments: Clear to auscultation bilaterally Abdominal:     General: Bowel sounds are normal.     Palpations: Abdomen is soft.     Tenderness: There is abdominal tenderness in the suprapubic area. There is no right CVA tenderness, left CVA tenderness, guarding or rebound.     Comments: Mild tenderness palpation in suprapubic region.  No evidence of acute abdomen on physical exam.  No CVA tenderness.  Psychiatric:        Behavior: Behavior is cooperative.      UC Treatments / Results  Labs (all labs ordered are listed, but only abnormal results are displayed) Labs Reviewed  POCT URINALYSIS DIP (MANUAL ENTRY) - Abnormal; Notable for the following components:      Result Value   Blood, UA trace-intact (*)    All other components within normal limits  URINE CULTURE  POCT URINE PREGNANCY    EKG   Radiology No results found.  Procedures Procedures (including critical  care time)  Medications Ordered in UC Medications - No data to display  Initial Impression / Assessment and Plan / UC Course  I have reviewed the triage vital signs and the nursing notes.  Pertinent labs & imaging results that were available during my care of the patient were reviewed by me and considered in my medical decision making (see chart for details).     Patient is well-appearing, afebrile, nontoxic, nontachycardic.  She does have blood but otherwise her UA is normal.  Given her classic UTI symptoms however, we will empirically treat and she was started on cephalexin 500 mg twice daily for 5 days.  Will send this for urine culture and discussed that we will either discontinue or change her antibiotics if necessary based on her culture results.  She is to rest and drink plenty of fluid.  Urine pregnancy was negative.  We discussed that if anything worsens or changes and she has back pain, fever, nausea, vomiting, flank pain, hematuria she should be seen emergently.  Strict return precautions given.  Excuse note provided.  Final Clinical Impressions(s) / UC Diagnoses   Final diagnoses:  Urinary frequency  UTI symptoms     Discharge Instructions      We are treating you for urinary tract infection.  Start cephalexin twice daily for 5 days.  Make sure that you are drinking plenty of fluid.  You can continue using Azo as needed.  We will send your urine for culture and contact you if any to stop or change her antibiotics based on culture results.  If you develop any pelvic pain, abdominal pain, fever, nausea, vomiting you should be seen immediately.     ED Prescriptions     Medication Sig Dispense Auth. Provider   cephALEXin (KEFLEX) 500 MG capsule Take 1 capsule (500 mg total) by mouth 2 (two) times daily for 5 days. 10 capsule Jeselle Hiser, Noberto Retort, PA-C      PDMP not reviewed this encounter.   Jeani Hawking, PA-C 01/21/23 1417

## 2023-01-21 NOTE — Discharge Instructions (Signed)
We are treating you for urinary tract infection.  Start cephalexin twice daily for 5 days.  Make sure that you are drinking plenty of fluid.  You can continue using Azo as needed.  We will send your urine for culture and contact you if any to stop or change her antibiotics based on culture results.  If you develop any pelvic pain, abdominal pain, fever, nausea, vomiting you should be seen immediately.

## 2023-01-21 NOTE — ED Triage Notes (Signed)
Patient presents with burning when urinating x day 2. Treated with Azo.

## 2023-01-22 LAB — URINE CULTURE: Culture: NO GROWTH

## 2023-03-25 DIAGNOSIS — L0232 Furuncle of buttock: Secondary | ICD-10-CM | POA: Diagnosis not present

## 2023-03-25 DIAGNOSIS — B999 Unspecified infectious disease: Secondary | ICD-10-CM | POA: Diagnosis not present

## 2023-07-15 DIAGNOSIS — S90212A Contusion of left great toe with damage to nail, initial encounter: Secondary | ICD-10-CM | POA: Diagnosis not present
